# Patient Record
Sex: Female | Born: 1990 | Race: White | Hispanic: No | Marital: Married | Smoking: Never smoker
Health system: Southern US, Community
[De-identification: ages and names within clinical notes are randomized; demographics above are authoritative.]

## PROBLEM LIST (undated history)

## (undated) DIAGNOSIS — F419 Anxiety disorder, unspecified: Secondary | ICD-10-CM

## (undated) DIAGNOSIS — D219 Benign neoplasm of connective and other soft tissue, unspecified: Secondary | ICD-10-CM

## (undated) DIAGNOSIS — O139 Gestational [pregnancy-induced] hypertension without significant proteinuria, unspecified trimester: Secondary | ICD-10-CM

## (undated) HISTORY — PX: HERNIA REPAIR: SHX51

## (undated) HISTORY — PX: COLOSTOMY: SHX63

## (undated) HISTORY — PX: OTHER SURGICAL HISTORY: SHX169

---

## 2019-01-20 LAB — OB RESULTS CONSOLE RPR: RPR: NONREACTIVE

## 2019-01-20 LAB — OB RESULTS CONSOLE RUBELLA ANTIBODY, IGM: Rubella: IMMUNE

## 2019-01-20 LAB — OB RESULTS CONSOLE ABO/RH: RH Type: POSITIVE

## 2019-01-20 LAB — OB RESULTS CONSOLE HIV ANTIBODY (ROUTINE TESTING): HIV: NONREACTIVE

## 2019-01-20 LAB — OB RESULTS CONSOLE ANTIBODY SCREEN: Antibody Screen: NEGATIVE

## 2019-01-20 LAB — OB RESULTS CONSOLE HEPATITIS B SURFACE ANTIGEN: Hepatitis B Surface Ag: NEGATIVE

## 2019-01-20 LAB — OB RESULTS CONSOLE GC/CHLAMYDIA
Chlamydia: NEGATIVE
Gonorrhea: NEGATIVE

## 2019-06-26 DIAGNOSIS — Z3490 Encounter for supervision of normal pregnancy, unspecified, unspecified trimester: Secondary | ICD-10-CM | POA: Diagnosis not present

## 2019-06-26 DIAGNOSIS — Z3403 Encounter for supervision of normal first pregnancy, third trimester: Secondary | ICD-10-CM | POA: Diagnosis not present

## 2019-07-03 DIAGNOSIS — Z3403 Encounter for supervision of normal first pregnancy, third trimester: Secondary | ICD-10-CM | POA: Diagnosis not present

## 2019-07-07 DIAGNOSIS — O99213 Obesity complicating pregnancy, third trimester: Secondary | ICD-10-CM | POA: Diagnosis not present

## 2019-07-07 DIAGNOSIS — Z23 Encounter for immunization: Secondary | ICD-10-CM | POA: Diagnosis not present

## 2019-08-05 DIAGNOSIS — O99213 Obesity complicating pregnancy, third trimester: Secondary | ICD-10-CM | POA: Diagnosis not present

## 2019-08-20 DIAGNOSIS — O321XX0 Maternal care for breech presentation, not applicable or unspecified: Secondary | ICD-10-CM | POA: Diagnosis not present

## 2019-08-20 DIAGNOSIS — Z3490 Encounter for supervision of normal pregnancy, unspecified, unspecified trimester: Secondary | ICD-10-CM | POA: Diagnosis not present

## 2019-08-22 ENCOUNTER — Encounter (HOSPITAL_COMMUNITY): Payer: Self-pay | Admitting: *Deleted

## 2019-08-22 NOTE — Patient Instructions (Signed)
Kimberly Prince  08/22/2019   Your procedure is scheduled on:  09/04/2019  Arrive at 1:15 PM at Entrance C on [address] at [facility]  and [employer] [employer] [address]. You are invited to use the FREE valet parking or use the Visitor's parking deck.  Pick up the phone at the desk and dial [phone].  Call this number if you have problems the morning of surgery: [phone]  Remember:   Do not eat food:(After Midnight) Desps de medianoche.  Do not drink clear liquids: (6 Hours before arrival) 6 horas ante llegada.  Take these medicines the morning of surgery with A SIP OF WATER:  none   Do not wear jewelry, make-up or nail polish.  Do not wear lotions, powders, or perfumes. Do not wear deodorant.  Do not shave 48 hours prior to surgery.  Do not bring valuables to the hospital.  [facility] is not   responsible for any belongings or valuables brought to the hospital.  Contacts, dentures or bridgework may not be worn into surgery.  Leave suitcase in the car. After surgery it may be brought to your room.  For patients admitted to the hospital, checkout time is 11:00 AM the day of              discharge.      Please read over the following fact sheets that you were given:     Preparing for Surgery

## 2019-08-28 ENCOUNTER — Other Ambulatory Visit: Payer: Self-pay

## 2019-08-28 ENCOUNTER — Encounter (HOSPITAL_COMMUNITY): Payer: Self-pay | Admitting: Obstetrics & Gynecology

## 2019-08-28 ENCOUNTER — Inpatient Hospital Stay (EMERGENCY_DEPARTMENT_HOSPITAL)
Admission: AD | Admit: 2019-08-28 | Discharge: 2019-08-29 | Disposition: A | Discharge status: Home / Self Care | Payer: BC Managed Care – PPO | Source: Home / Self Care | Attending: Obstetrics & Gynecology | Admitting: Obstetrics & Gynecology

## 2019-08-28 DIAGNOSIS — O321XX0 Maternal care for breech presentation, not applicable or unspecified: Secondary | ICD-10-CM | POA: Insufficient documentation

## 2019-08-28 DIAGNOSIS — Z3403 Encounter for supervision of normal first pregnancy, third trimester: Secondary | ICD-10-CM | POA: Diagnosis not present

## 2019-08-28 DIAGNOSIS — Z3A38 38 weeks gestation of pregnancy: Secondary | ICD-10-CM | POA: Insufficient documentation

## 2019-08-28 DIAGNOSIS — Z3490 Encounter for supervision of normal pregnancy, unspecified, unspecified trimester: Secondary | ICD-10-CM | POA: Diagnosis not present

## 2019-08-28 DIAGNOSIS — O219 Vomiting of pregnancy, unspecified: Secondary | ICD-10-CM | POA: Diagnosis not present

## 2019-08-28 DIAGNOSIS — O479 False labor, unspecified: Secondary | ICD-10-CM

## 2019-08-28 DIAGNOSIS — O471 False labor at or after 37 completed weeks of gestation: Secondary | ICD-10-CM | POA: Insufficient documentation

## 2019-08-28 HISTORY — DX: Anxiety disorder, unspecified: F41.9

## 2019-08-28 NOTE — MAU Note (Signed)
Pt states she started having cramps Monday and then it stopped for a while and came back the next day and stopped for a while. Today she states they feel more like contractions that started around 1730 and they have not stopped. Rates them a 7/10 on the pain scale. Denies LOF/VB. Reports good fetal movement.

## 2019-08-29 DIAGNOSIS — Q423 Congenital absence, atresia and stenosis of anus without fistula: Secondary | ICD-10-CM | POA: Diagnosis not present

## 2019-08-29 DIAGNOSIS — O321XX0 Maternal care for breech presentation, not applicable or unspecified: Secondary | ICD-10-CM | POA: Diagnosis not present

## 2019-08-29 DIAGNOSIS — O471 False labor at or after 37 completed weeks of gestation: Secondary | ICD-10-CM | POA: Diagnosis not present

## 2019-08-29 DIAGNOSIS — Z3A38 38 weeks gestation of pregnancy: Secondary | ICD-10-CM | POA: Diagnosis not present

## 2019-08-29 DIAGNOSIS — Q439 Congenital malformation of intestine, unspecified: Secondary | ICD-10-CM | POA: Diagnosis not present

## 2019-08-29 DIAGNOSIS — O1413 Severe pre-eclampsia, third trimester: Secondary | ICD-10-CM | POA: Diagnosis not present

## 2019-08-29 DIAGNOSIS — Z20822 Contact with and (suspected) exposure to covid-19: Secondary | ICD-10-CM | POA: Diagnosis not present

## 2019-08-29 DIAGNOSIS — Z01818 Encounter for other preprocedural examination: Secondary | ICD-10-CM | POA: Diagnosis not present

## 2019-08-29 DIAGNOSIS — O1414 Severe pre-eclampsia complicating childbirth: Secondary | ICD-10-CM | POA: Diagnosis not present

## 2019-08-29 DIAGNOSIS — O99214 Obesity complicating childbirth: Secondary | ICD-10-CM | POA: Diagnosis not present

## 2019-08-29 DIAGNOSIS — Z23 Encounter for immunization: Secondary | ICD-10-CM | POA: Diagnosis not present

## 2019-08-29 DIAGNOSIS — O99824 Streptococcus B carrier state complicating childbirth: Secondary | ICD-10-CM | POA: Diagnosis not present

## 2019-08-29 MED ORDER — TYLENOL PM EXTRA STRENGTH 500-25 MG PO TABS: 1.0000 | ORAL_TABLET | Freq: Every evening | ORAL | 0 refills | Status: DC | PRN

## 2019-08-29 NOTE — MAU Provider Note (Signed)
First Provider Initiated Contact with Patient 08/28/19 2248      S: Kimberly Prince is a 29 y.o. G1P0 at [redacted]w[redacted]d  who presents to MAU today complaining of "crampy" contractions since 1730 08/28/2019. She denies vaginal bleeding. She denies LOF. She reports normal fetal movement.  She rates her pain as 7/10. She receives care with [facility].  O: BP 124/62   Pulse 90   Temp 98.1 F (36.7 C)   Resp 18   SpO2 99% Comment: room air GENERAL: Well-developed, well-nourished female in no acute distress.  HEAD: Normocephalic, atraumatic.  CHEST: Normal effort of breathing, regular heart rate ABDOMEN: Soft, nontender, gravid  Cervical exam:  Dilation: 1.5 Effacement (%): 20 Cervical Position: Posterior Station: -3 Presentation: Pilar Plate Breech Exam by:: L.Cox Therapist, sports    Fetal Monitoring: Baseline: 130 Variability: Mod Accelerations: 15 x 15 Decelerations: None Contractions: Rare, UI noted   A: SIUP at [redacted]w[redacted]d  Known breech position, problem list updated False labor Cervix remains 1.5/20/-3 90 minutes after exam Rare contractions on toco  Meds ordered this encounter  Medications  . diphenhydramine-acetaminophen (TYLENOL PM EXTRA STRENGTH) 25-500 MG TABS tablet    Sig: Take 1 tablet by mouth at bedtime as needed for up to 7 doses.    Dispense:  7 tablet    Refill:  0    Order Specific Question:   Supervising Provider    Answer:   Woodroe Mode [[id number]]   P: --Discharge home in stable condition --Scheduled for primary cesarean for breech on 09/04/2019  Mallie Snooks, CNM 08/29/2019 12:37 AM

## 2019-08-29 NOTE — Discharge Instructions (Signed)
Braxton Hicks Contractions °Contractions of the uterus can occur throughout pregnancy, but they are not always a sign that you are in labor. You may have practice contractions called Braxton Hicks contractions. These false labor contractions are sometimes confused with true labor. °What are Braxton Hicks contractions? °Braxton Hicks contractions are tightening movements that occur in the muscles of the uterus before labor. Unlike true labor contractions, these contractions do not result in opening (dilation) and thinning of the cervix. Toward the end of pregnancy (32-34 weeks), Braxton Hicks contractions can happen more often and may become stronger. These contractions are sometimes difficult to tell apart from true labor because they can be very uncomfortable. You should not feel embarrassed if you go to the hospital with false labor. °Sometimes, the only way to tell if you are in true labor is for your health care provider to look for changes in the cervix. The health care provider will do a physical exam and may monitor your contractions. If you are not in true labor, the exam should show that your cervix is not dilating and your water has not broken. °If there are no other health problems associated with your pregnancy, it is completely safe for you to be sent home with false labor. You may continue to have Braxton Hicks contractions until you go into true labor. °How to tell the difference between true labor and false labor °True labor °· Contractions last 30-70 seconds. °· Contractions become very regular. °· Discomfort is usually felt in the top of the uterus, and it spreads to the lower abdomen and low back. °· Contractions do not go away with walking. °· Contractions usually become more intense and increase in frequency. °· The cervix dilates and gets thinner. °False labor °· Contractions are usually shorter and not as strong as true labor contractions. °· Contractions are usually irregular. °· Contractions  are often felt in the front of the lower abdomen and in the groin. °· Contractions may go away when you walk around or change positions while lying down. °· Contractions get weaker and are shorter-lasting as time goes on. °· The cervix usually does not dilate or become thin. °Follow these instructions at home: ° °· Take over-the-counter and prescription medicines only as told by your health care provider. °· Keep up with your usual exercises and follow other instructions from your health care provider. °· Eat and drink lightly if you think you are going into labor. °· If Braxton Hicks contractions are making you uncomfortable: °? Change your position from lying down or resting to walking, or change from walking to resting. °? Sit and rest in a tub of warm water. °? Drink enough fluid to keep your urine pale yellow. Dehydration may cause these contractions. °? Do slow and deep breathing several times an hour. °· Keep all follow-up prenatal visits as told by your health care provider. This is important. °Contact a health care provider if: °· You have a fever. °· You have continuous pain in your abdomen. °Get help right away if: °· Your contractions become stronger, more regular, and closer together. °· You have fluid leaking or gushing from your vagina. °· You pass blood-tinged mucus (bloody show). °· You have bleeding from your vagina. °· You have low back pain that you never had before. °· You feel your baby’s head pushing down and causing pelvic pressure. °· Your baby is not moving inside you as much as it used to. °Summary °· Contractions that occur before labor are   called Braxton Hicks contractions, false labor, or practice contractions. °· Braxton Hicks contractions are usually shorter, weaker, farther apart, and less regular than true labor contractions. True labor contractions usually become progressively stronger and regular, and they become more frequent. °· Manage discomfort from Braxton Hicks contractions  by changing position, resting in a warm bath, drinking plenty of water, or practicing deep breathing. °This information is not intended to replace advice given to you by your health care provider. Make sure you discuss any questions you have with your health care provider. °Document Revised: 12/15/2016 Document Reviewed: 05/18/2016 °Elsevier Patient Education © 2020 [employer] ° °

## 2019-09-01 ENCOUNTER — Inpatient Hospital Stay (HOSPITAL_COMMUNITY)
Admission: AD | Admit: 2019-09-01 | Payer: BC Managed Care – PPO | Source: Home / Self Care | Admitting: Obstetrics and Gynecology

## 2019-09-01 ENCOUNTER — Inpatient Hospital Stay (HOSPITAL_COMMUNITY)
Admission: AD | Admit: 2019-09-01 | Discharge: 2019-09-04 | DRG: 788 | Disposition: A | Discharge status: Home / Self Care | Payer: BC Managed Care – PPO | Attending: Obstetrics and Gynecology | Admitting: Obstetrics and Gynecology

## 2019-09-01 ENCOUNTER — Inpatient Hospital Stay (HOSPITAL_COMMUNITY): Payer: BC Managed Care – PPO | Admitting: Anesthesiology

## 2019-09-01 ENCOUNTER — Encounter (HOSPITAL_COMMUNITY)
Admission: AD | Disposition: A | Discharge status: Home / Self Care | Payer: Self-pay | Source: Home / Self Care | Attending: Obstetrics and Gynecology

## 2019-09-01 ENCOUNTER — Other Ambulatory Visit: Payer: Self-pay

## 2019-09-01 ENCOUNTER — Encounter (HOSPITAL_COMMUNITY): Payer: Self-pay | Admitting: Obstetrics and Gynecology

## 2019-09-01 DIAGNOSIS — O1413 Severe pre-eclampsia, third trimester: Secondary | ICD-10-CM

## 2019-09-01 DIAGNOSIS — O1414 Severe pre-eclampsia complicating childbirth: Principal | ICD-10-CM | POA: Diagnosis present

## 2019-09-01 DIAGNOSIS — Z3A38 38 weeks gestation of pregnancy: Secondary | ICD-10-CM

## 2019-09-01 DIAGNOSIS — Z20822 Contact with and (suspected) exposure to covid-19: Secondary | ICD-10-CM | POA: Diagnosis present

## 2019-09-01 DIAGNOSIS — O99214 Obesity complicating childbirth: Secondary | ICD-10-CM | POA: Diagnosis present

## 2019-09-01 DIAGNOSIS — Z01818 Encounter for other preprocedural examination: Secondary | ICD-10-CM | POA: Diagnosis not present

## 2019-09-01 DIAGNOSIS — O321XX0 Maternal care for breech presentation, not applicable or unspecified: Secondary | ICD-10-CM | POA: Diagnosis present

## 2019-09-01 DIAGNOSIS — O99824 Streptococcus B carrier state complicating childbirth: Secondary | ICD-10-CM | POA: Diagnosis present

## 2019-09-01 DIAGNOSIS — Z3689 Encounter for other specified antenatal screening: Secondary | ICD-10-CM

## 2019-09-01 LAB — COMPREHENSIVE METABOLIC PANEL
ALT: 21 U/L (ref 0–44)
AST: 18 U/L (ref 15–41)
Albumin: 2.7 g/dL — ABNORMAL LOW (ref 3.5–5.0)
Alkaline Phosphatase: 152 U/L — ABNORMAL HIGH (ref 38–126)
Anion gap: 10 (ref 5–15)
BUN: <5 mg/dL — ABNORMAL LOW (ref 6–20)
CO2: 20 mmol/L — ABNORMAL LOW (ref 22–32)
Calcium: 9.2 mg/dL (ref 8.9–10.3)
Chloride: 104 mmol/L (ref 98–111)
Creatinine, Ser: 0.5 mg/dL (ref 0.44–1.00)
GFR calc Af Amer: >60 mL/min (ref >60)
GFR calc non Af Amer: >60 mL/min (ref >60)
Glucose, Bld: 104 mg/dL — ABNORMAL HIGH (ref 70–99)
Potassium: 3.9 mmol/L (ref 3.5–5.1)
Sodium: 134 mmol/L — ABNORMAL LOW (ref 135–145)
Total Bilirubin: 0.4 mg/dL (ref 0.3–1.2)
Total Protein: 6.4 g/dL — ABNORMAL LOW (ref 6.5–8.1)

## 2019-09-01 LAB — URINALYSIS, ROUTINE W REFLEX MICROSCOPIC
Bilirubin Urine: NEGATIVE
Glucose, UA: NEGATIVE mg/dL
Hgb urine dipstick: NEGATIVE
Ketones, ur: NEGATIVE mg/dL
Nitrite: NEGATIVE
Protein, ur: NEGATIVE mg/dL
Specific Gravity, Urine: 1.014 (ref 1.005–1.030)
pH: 6 (ref 5.0–8.0)

## 2019-09-01 LAB — TYPE AND SCREEN
ABO/RH(D): A POS
Antibody Screen: NEGATIVE

## 2019-09-01 LAB — CBC
HCT: 38.4 % (ref 36.0–46.0)
Hemoglobin: 12.5 g/dL (ref 12.0–15.0)
MCH: 27.7 pg (ref 26.0–34.0)
MCHC: 32.6 g/dL (ref 30.0–36.0)
MCV: 85 fL (ref 80.0–100.0)
Platelets: 223 10*3/uL (ref 150–400)
RBC: 4.52 MIL/uL (ref 3.87–5.11)
RDW: 12.9 % (ref 11.5–15.5)
WBC: 12.9 10*3/uL — ABNORMAL HIGH (ref 4.0–10.5)
nRBC: 0 % (ref 0.0–0.2)

## 2019-09-01 LAB — PROTEIN / CREATININE RATIO, URINE
Creatinine, Urine: 177.03 mg/dL
Protein Creatinine Ratio: 0.11 mg/mg{Cre} (ref 0.00–0.15)
Total Protein, Urine: 20 mg/dL

## 2019-09-01 LAB — SARS CORONAVIRUS 2 BY RT PCR (HOSPITAL ORDER, PERFORMED IN ~~LOC~~ HOSPITAL LAB): SARS Coronavirus 2: NEGATIVE

## 2019-09-01 SURGERY — Surgical Case
Anesthesia: Spinal

## 2019-09-01 MED ORDER — LORATADINE 10 MG PO TABS
10.0000 mg | ORAL_TABLET | Freq: Every day | ORAL | Status: DC
  Administered 2019-09-01 – 2019-09-03 (×3): 10 mg via ORAL
  Filled 2019-09-01 (×4): qty 1

## 2019-09-01 MED ORDER — SIMETHICONE 80 MG PO CHEW
80.0000 mg | CHEWABLE_TABLET | ORAL | Status: DC
  Administered 2019-09-02 – 2019-09-04 (×3): 80 mg via ORAL
  Filled 2019-09-01 (×3): qty 1

## 2019-09-01 MED ORDER — KETOROLAC TROMETHAMINE 30 MG/ML IJ SOLN
INTRAMUSCULAR | Status: AC
  Filled 2019-09-01: qty 1

## 2019-09-01 MED ORDER — NALBUPHINE HCL 10 MG/ML IJ SOLN: 5.0000 mg | INTRAMUSCULAR | Status: DC | PRN

## 2019-09-01 MED ORDER — MEPERIDINE HCL 25 MG/ML IJ SOLN: 6.2500 mg | INTRAMUSCULAR | Status: DC | PRN

## 2019-09-01 MED ORDER — WITCH HAZEL-GLYCERIN EX PADS: 1.0000 "application " | MEDICATED_PAD | CUTANEOUS | Status: DC | PRN

## 2019-09-01 MED ORDER — PRENATAL MULTIVITAMIN CH
1.0000 | ORAL_TABLET | Freq: Every day | ORAL | Status: DC
  Administered 2019-09-02 – 2019-09-03 (×2): 1 via ORAL
  Filled 2019-09-01 (×2): qty 1

## 2019-09-01 MED ORDER — SODIUM CHLORIDE 0.9 % IV SOLN: INTRAVENOUS | Status: DC | PRN

## 2019-09-01 MED ORDER — KETOROLAC TROMETHAMINE 30 MG/ML IJ SOLN: 30.0000 mg | Freq: Four times a day (QID) | INTRAMUSCULAR | Status: AC | PRN

## 2019-09-01 MED ORDER — NALOXONE HCL 4 MG/10ML IJ SOLN
1.0000 ug/kg/h | INTRAVENOUS | Status: DC | PRN
  Filled 2019-09-01: qty 5

## 2019-09-01 MED ORDER — DIPHENHYDRAMINE HCL 25 MG PO CAPS: 25.0000 mg | ORAL_CAPSULE | ORAL | Status: DC | PRN

## 2019-09-01 MED ORDER — STERILE WATER FOR IRRIGATION IR SOLN
Status: DC | PRN
  Administered 2019-09-01: 1000 mL

## 2019-09-01 MED ORDER — MORPHINE SULFATE (PF) 0.5 MG/ML IJ SOLN
INTRAMUSCULAR | Status: DC | PRN
  Administered 2019-09-01: .15 mg via INTRATHECAL

## 2019-09-01 MED ORDER — DEXAMETHASONE SODIUM PHOSPHATE 10 MG/ML IJ SOLN
INTRAMUSCULAR | Status: AC
  Filled 2019-09-01: qty 1

## 2019-09-01 MED ORDER — DEXTROSE 5 % IV SOLN
INTRAVENOUS | Status: AC
  Filled 2019-09-01: qty 3000

## 2019-09-01 MED ORDER — ONDANSETRON HCL 4 MG/2ML IJ SOLN
INTRAMUSCULAR | Status: DC | PRN
  Administered 2019-09-01: 4 mg via INTRAVENOUS

## 2019-09-01 MED ORDER — NALBUPHINE HCL 10 MG/ML IJ SOLN: 5.0000 mg | Freq: Once | INTRAMUSCULAR | Status: DC | PRN

## 2019-09-01 MED ORDER — FENTANYL CITRATE (PF) 100 MCG/2ML IJ SOLN
INTRAMUSCULAR | Status: AC
  Filled 2019-09-01: qty 2

## 2019-09-01 MED ORDER — HYDRALAZINE HCL 20 MG/ML IJ SOLN: 10.0000 mg | INTRAMUSCULAR | Status: DC | PRN

## 2019-09-01 MED ORDER — SODIUM CHLORIDE 0.9% FLUSH: 3.0000 mL | INTRAVENOUS | Status: DC | PRN

## 2019-09-01 MED ORDER — DIPHENHYDRAMINE HCL 25 MG PO CAPS: 25.0000 mg | ORAL_CAPSULE | Freq: Four times a day (QID) | ORAL | Status: DC | PRN

## 2019-09-01 MED ORDER — SIMETHICONE 80 MG PO CHEW
80.0000 mg | CHEWABLE_TABLET | Freq: Three times a day (TID) | ORAL | Status: DC
  Administered 2019-09-01 – 2019-09-04 (×8): 80 mg via ORAL
  Filled 2019-09-01 (×8): qty 1

## 2019-09-01 MED ORDER — FENTANYL CITRATE (PF) 100 MCG/2ML IJ SOLN
INTRAMUSCULAR | Status: DC | PRN
  Administered 2019-09-01: 15 ug via INTRATHECAL

## 2019-09-01 MED ORDER — ACETAMINOPHEN 500 MG PO TABS: 1000.0000 mg | ORAL_TABLET | Freq: Four times a day (QID) | ORAL | Status: DC

## 2019-09-01 MED ORDER — MENTHOL 3 MG MT LOZG: 1.0000 | LOZENGE | OROMUCOSAL | Status: DC | PRN

## 2019-09-01 MED ORDER — DIBUCAINE (PERIANAL) 1 % EX OINT: 1.0000 "application " | TOPICAL_OINTMENT | CUTANEOUS | Status: DC | PRN

## 2019-09-01 MED ORDER — HYDROMORPHONE HCL 1 MG/ML IJ SOLN: 0.2000 mg | INTRAMUSCULAR | Status: DC | PRN

## 2019-09-01 MED ORDER — NALOXONE HCL 0.4 MG/ML IJ SOLN: 0.4000 mg | INTRAMUSCULAR | Status: DC | PRN

## 2019-09-01 MED ORDER — HYDROMORPHONE HCL 1 MG/ML IJ SOLN: 0.2500 mg | INTRAMUSCULAR | Status: DC | PRN

## 2019-09-01 MED ORDER — DIPHENHYDRAMINE HCL 50 MG/ML IJ SOLN: 12.5000 mg | INTRAMUSCULAR | Status: DC | PRN

## 2019-09-01 MED ORDER — ACETAMINOPHEN 500 MG PO TABS
1000.0000 mg | ORAL_TABLET | Freq: Four times a day (QID) | ORAL | Status: DC
  Administered 2019-09-01 – 2019-09-04 (×10): 1000 mg via ORAL
  Filled 2019-09-01 (×11): qty 2

## 2019-09-01 MED ORDER — DIPHENHYDRAMINE HCL 50 MG/ML IJ SOLN
INTRAMUSCULAR | Status: AC
  Filled 2019-09-01: qty 1

## 2019-09-01 MED ORDER — PROMETHAZINE HCL 25 MG/ML IJ SOLN: 6.2500 mg | INTRAMUSCULAR | Status: DC | PRN

## 2019-09-01 MED ORDER — LABETALOL HCL 5 MG/ML IV SOLN: 40.0000 mg | INTRAVENOUS | Status: DC | PRN

## 2019-09-01 MED ORDER — SERTRALINE HCL 50 MG PO TABS
50.0000 mg | ORAL_TABLET | Freq: Every day | ORAL | Status: DC
  Administered 2019-09-01 – 2019-09-03 (×3): 50 mg via ORAL
  Filled 2019-09-01 (×3): qty 1

## 2019-09-01 MED ORDER — PHENYLEPHRINE HCL (PRESSORS) 10 MG/ML IV SOLN
INTRAVENOUS | Status: DC | PRN
  Administered 2019-09-01: 200 ug via INTRAVENOUS
  Administered 2019-09-01: 100 ug via INTRAVENOUS

## 2019-09-01 MED ORDER — FERROUS SULFATE 325 (65 FE) MG PO TABS
325.0000 mg | ORAL_TABLET | Freq: Two times a day (BID) | ORAL | Status: DC
  Administered 2019-09-01 – 2019-09-04 (×6): 325 mg via ORAL
  Filled 2019-09-01 (×6): qty 1

## 2019-09-01 MED ORDER — BUPIVACAINE IN DEXTROSE 0.75-8.25 % IT SOLN
INTRATHECAL | Status: DC | PRN
  Administered 2019-09-01: 1.6 mL via INTRATHECAL

## 2019-09-01 MED ORDER — KETOROLAC TROMETHAMINE 30 MG/ML IJ SOLN
30.0000 mg | Freq: Once | INTRAMUSCULAR | Status: AC | PRN
  Administered 2019-09-01: 30 mg via INTRAVENOUS

## 2019-09-01 MED ORDER — SCOPOLAMINE 1 MG/3DAYS TD PT72
1.0000 | MEDICATED_PATCH | Freq: Once | TRANSDERMAL | Status: AC
  Administered 2019-09-01: 1.5 mg via TRANSDERMAL

## 2019-09-01 MED ORDER — SCOPOLAMINE 1 MG/3DAYS TD PT72
MEDICATED_PATCH | TRANSDERMAL | Status: AC
  Filled 2019-09-01: qty 1

## 2019-09-01 MED ORDER — PHENYLEPHRINE HCL-NACL 20-0.9 MG/250ML-% IV SOLN
INTRAVENOUS | Status: DC | PRN
  Administered 2019-09-01: 60 ug/min via INTRAVENOUS

## 2019-09-01 MED ORDER — OXYCODONE HCL 5 MG PO TABS
5.0000 mg | ORAL_TABLET | ORAL | Status: DC | PRN
  Administered 2019-09-03 (×2): 5 mg via ORAL
  Filled 2019-09-01 (×2): qty 1

## 2019-09-01 MED ORDER — LABETALOL HCL 5 MG/ML IV SOLN: 20.0000 mg | INTRAVENOUS | Status: DC | PRN

## 2019-09-01 MED ORDER — LABETALOL HCL 5 MG/ML IV SOLN: 80.0000 mg | INTRAVENOUS | Status: DC | PRN

## 2019-09-01 MED ORDER — IBUPROFEN 800 MG PO TABS
800.0000 mg | ORAL_TABLET | Freq: Three times a day (TID) | ORAL | Status: DC
  Administered 2019-09-02 – 2019-09-04 (×6): 800 mg via ORAL
  Filled 2019-09-01 (×6): qty 1

## 2019-09-01 MED ORDER — CARBOPROST TROMETHAMINE 250 MCG/ML IM SOLN
INTRAMUSCULAR | Status: AC
  Filled 2019-09-01: qty 1

## 2019-09-01 MED ORDER — COCONUT OIL OIL: 1.0000 "application " | TOPICAL_OIL | Status: DC | PRN

## 2019-09-01 MED ORDER — DEXTROSE 5 % IV SOLN
3.0000 g | INTRAVENOUS | Status: AC
  Administered 2019-09-01: 3 g via INTRAVENOUS

## 2019-09-01 MED ORDER — OXYTOCIN-SODIUM CHLORIDE 30-0.9 UT/500ML-% IV SOLN
INTRAVENOUS | Status: DC | PRN
  Administered 2019-09-01: 30 [IU] via INTRAVENOUS

## 2019-09-01 MED ORDER — SENNOSIDES-DOCUSATE SODIUM 8.6-50 MG PO TABS
2.0000 | ORAL_TABLET | ORAL | Status: DC
  Administered 2019-09-02 – 2019-09-04 (×3): 2 via ORAL
  Filled 2019-09-01 (×3): qty 2

## 2019-09-01 MED ORDER — ZOLPIDEM TARTRATE 5 MG PO TABS: 5.0000 mg | ORAL_TABLET | Freq: Every evening | ORAL | Status: DC | PRN

## 2019-09-01 MED ORDER — SIMETHICONE 80 MG PO CHEW: 80.0000 mg | CHEWABLE_TABLET | ORAL | Status: DC | PRN

## 2019-09-01 MED ORDER — LACTATED RINGERS IV SOLN: INTRAVENOUS | Status: DC

## 2019-09-01 MED ORDER — MAGNESIUM SULFATE BOLUS VIA INFUSION
4.0000 g | Freq: Once | INTRAVENOUS | Status: AC
  Administered 2019-09-01: 4 g via INTRAVENOUS
  Filled 2019-09-01: qty 1000

## 2019-09-01 MED ORDER — PHENYLEPHRINE 40 MCG/ML (10ML) SYRINGE FOR IV PUSH (FOR BLOOD PRESSURE SUPPORT)
PREFILLED_SYRINGE | INTRAVENOUS | Status: AC
  Filled 2019-09-01: qty 10

## 2019-09-01 MED ORDER — ENOXAPARIN SODIUM 80 MG/0.8ML ~~LOC~~ SOLN
70.0000 mg | SUBCUTANEOUS | Status: DC
  Administered 2019-09-02 – 2019-09-03 (×2): 70 mg via SUBCUTANEOUS
  Filled 2019-09-01 (×2): qty 0.8

## 2019-09-01 MED ORDER — DIPHENHYDRAMINE HCL 50 MG/ML IJ SOLN
INTRAMUSCULAR | Status: DC | PRN
  Administered 2019-09-01: 25 mg via INTRAVENOUS

## 2019-09-01 MED ORDER — ONDANSETRON HCL 4 MG/2ML IJ SOLN: 4.0000 mg | Freq: Three times a day (TID) | INTRAMUSCULAR | Status: DC | PRN

## 2019-09-01 MED ORDER — SOD CITRATE-CITRIC ACID 500-334 MG/5ML PO SOLN
30.0000 mL | ORAL | Status: AC
  Administered 2019-09-01: 30 mL via ORAL
  Filled 2019-09-01: qty 30

## 2019-09-01 MED ORDER — ONDANSETRON HCL 4 MG/2ML IJ SOLN
INTRAMUSCULAR | Status: AC
  Filled 2019-09-01: qty 2

## 2019-09-01 MED ORDER — MORPHINE SULFATE (PF) 0.5 MG/ML IJ SOLN
INTRAMUSCULAR | Status: AC
  Filled 2019-09-01: qty 10

## 2019-09-01 MED ORDER — CARBOPROST TROMETHAMINE 250 MCG/ML IM SOLN
INTRAMUSCULAR | Status: DC | PRN
Start: 2019-09-01 — End: 2019-09-01
  Administered 2019-09-01: 250 ug via INTRAMUSCULAR

## 2019-09-01 MED ORDER — DEXAMETHASONE SODIUM PHOSPHATE 4 MG/ML IJ SOLN
INTRAMUSCULAR | Status: DC | PRN
  Administered 2019-09-01: 10 mg via INTRAVENOUS

## 2019-09-01 MED ORDER — OXYTOCIN-SODIUM CHLORIDE 30-0.9 UT/500ML-% IV SOLN
INTRAVENOUS | Status: AC
  Filled 2019-09-01: qty 500

## 2019-09-01 MED ORDER — IBUPROFEN 800 MG PO TABS: 800.0000 mg | ORAL_TABLET | Freq: Three times a day (TID) | ORAL | Status: DC

## 2019-09-01 MED ORDER — SODIUM CHLORIDE 0.9 % IR SOLN
Status: DC | PRN
  Administered 2019-09-01: 1

## 2019-09-01 MED ORDER — OXYTOCIN-SODIUM CHLORIDE 30-0.9 UT/500ML-% IV SOLN: 2.5000 [IU]/h | INTRAVENOUS | Status: AC

## 2019-09-01 MED ORDER — MAGNESIUM SULFATE 40 GM/1000ML IV SOLN
2.0000 g/h | INTRAVENOUS | Status: AC
  Administered 2019-09-02: 2 g/h via INTRAVENOUS
  Filled 2019-09-01 (×2): qty 1000

## 2019-09-01 SURGICAL SUPPLY — 38 items
BARRIER ADHS 3X4 INTERCEED (GAUZE/BANDAGES/DRESSINGS) ×3 IMPLANT
BENZOIN TINCTURE PRP APPL 2/3 (GAUZE/BANDAGES/DRESSINGS) ×3 IMPLANT
CHLORAPREP W/TINT 26ML (MISCELLANEOUS) ×3 IMPLANT
CLAMP CORD UMBIL (MISCELLANEOUS) IMPLANT
CLOSURE WOUND 1/2 X4 (GAUZE/BANDAGES/DRESSINGS) ×1
CLOTH BEACON ORANGE TIMEOUT ST (SAFETY) ×3 IMPLANT
DRSG OPSITE POSTOP 4X10 (GAUZE/BANDAGES/DRESSINGS) ×3 IMPLANT
ELECT REM PT RETURN 9FT ADLT (ELECTROSURGICAL) ×3
ELECTRODE REM PT RTRN 9FT ADLT (ELECTROSURGICAL) ×1 IMPLANT
EXTRACTOR VACUUM KIWI (MISCELLANEOUS) IMPLANT
GLOVE BIOGEL M 7.0 STRL (GLOVE) ×6 IMPLANT
GLOVE BIOGEL PI IND STRL 7.0 (GLOVE) ×3 IMPLANT
GLOVE BIOGEL PI INDICATOR 7.0 (GLOVE) ×6
GOWN STRL REUS W/TWL LRG LVL3 (GOWN DISPOSABLE) ×9 IMPLANT
HOVERMATT SINGLE USE (MISCELLANEOUS) ×3 IMPLANT
KIT ABG SYR 3ML LUER SLIP (SYRINGE) IMPLANT
NEEDLE HYPO 25X5/8 SAFETYGLIDE (NEEDLE) IMPLANT
NS IRRIG 1000ML POUR BTL (IV SOLUTION) ×3 IMPLANT
PACK C SECTION WH (CUSTOM PROCEDURE TRAY) ×3 IMPLANT
PAD OB MATERNITY 4.3X12.25 (PERSONAL CARE ITEMS) ×3 IMPLANT
PENCIL SMOKE EVAC W/HOLSTER (ELECTROSURGICAL) ×3 IMPLANT
RETRACTOR TRAXI PANNICULUS (MISCELLANEOUS) ×1 IMPLANT
RTRCTR C-SECT PINK 25CM LRG (MISCELLANEOUS) IMPLANT
SPONGE LAP 18X18 RF (DISPOSABLE) ×3 IMPLANT
STRIP CLOSURE SKIN 1/2X4 (GAUZE/BANDAGES/DRESSINGS) ×2 IMPLANT
SUT PDS AB 0 CT1 27 (SUTURE) ×6 IMPLANT
SUT PLAIN 0 NONE (SUTURE) IMPLANT
SUT VIC AB 0 CTX 36 (SUTURE) ×6
SUT VIC AB 0 CTX36XBRD ANBCTRL (SUTURE) ×3 IMPLANT
SUT VIC AB 2-0 CT1 27 (SUTURE) ×2
SUT VIC AB 2-0 CT1 TAPERPNT 27 (SUTURE) ×1 IMPLANT
SUT VIC AB 3-0 SH 27 (SUTURE)
SUT VIC AB 3-0 SH 27X BRD (SUTURE) IMPLANT
SUT VIC AB 4-0 KS 27 (SUTURE) ×3 IMPLANT
TOWEL OR 17X24 6PK STRL BLUE (TOWEL DISPOSABLE) ×3 IMPLANT
TRAXI PANNICULUS RETRACTOR (MISCELLANEOUS) ×2
TRAY FOLEY W/BAG SLVR 14FR LF (SET/KITS/TRAYS/PACK) ×3 IMPLANT
WATER STERILE IRR 1000ML POUR (IV SOLUTION) ×3 IMPLANT

## 2019-09-01 NOTE — MAU Note (Signed)
Pt was seen in MAU on Thursday for cramps and was found to have elevated BP.  Was seen in office today for f/u and BP still elevated.  No HA or blurred vision.  Had HA on Friday and seeing some floaters over the weekend, but not now.  Reports good fetal movement.

## 2019-09-01 NOTE — Anesthesia Procedure Notes (Signed)
Spinal  Patient location during procedure: OR Start time: 09/01/2019 2:07 PM End time: 09/01/2019 2:10 PM Staffing Performed: anesthesiologist  Anesthesiologist: Lyn Hollingshead, MD Preanesthetic Checklist Completed: patient identified, IV checked, site marked, risks and benefits discussed, surgical consent, monitors and equipment checked, pre-op evaluation and timeout performed Spinal Block Patient position: sitting Prep: DuraPrep and site prepped and draped Patient monitoring: continuous pulse ox and blood pressure Approach: midline Location: L3-4 Injection technique: single-shot Needle Needle type: Pencan  Needle gauge: 24 G Needle length: 10 cm Needle insertion depth: 7 cm Assessment Sensory level: T4

## 2019-09-01 NOTE — Anesthesia Preprocedure Evaluation (Signed)
Anesthesia Evaluation  Patient identified by MRN, date of birth, ID band Patient awake    Reviewed: Allergy & Precautions, H&P , NPO status , Patient's Chart, lab work & pertinent test results  Airway Mallampati: III  TM Distance: >3 FB Neck ROM: full    Dental no notable dental hx.    Pulmonary neg pulmonary ROS,    Pulmonary exam normal breath sounds clear to auscultation       Cardiovascular  Rhythm:regular Rate:Normal     Neuro/Psych PSYCHIATRIC DISORDERS Anxiety negative neurological ROS     GI/Hepatic negative GI ROS, Neg liver ROS,   Endo/Other  Morbid obesity  Renal/GU negative Renal ROS  negative genitourinary   Musculoskeletal negative musculoskeletal ROS (+)   Abdominal   Peds  Hematology negative hematology ROS (+)   Anesthesia Other Findings   Reproductive/Obstetrics (+) Pregnancy                             Anesthesia Physical Anesthesia Plan  ASA: III  Anesthesia Plan: Spinal   Post-op Pain Management:    Induction:   PONV Risk Score and Plan: Ondansetron, Dexamethasone and Scopolamine patch - Pre-op  Airway Management Planned: Natural Airway and Nasal Cannula  Additional Equipment: None  Intra-op Plan:   Post-operative Plan:   Informed Consent: I have reviewed the patients History and Physical, chart, labs and discussed the procedure including the risks, benefits and alternatives for the proposed anesthesia with the patient or authorized representative who has indicated his/her understanding and acceptance.       Plan Discussed with: CRNA  Anesthesia Plan Comments:         Anesthesia Quick Evaluation

## 2019-09-01 NOTE — Op Note (Signed)
Cesarean Section Procedure Note  Indications: malpresentation: Breech and severe preeclampsia  Pre-operative Diagnosis: 38 week 4 day pregnancy.  Post-operative Diagnosis: same  Surgeon: Christophe Louis M.D.  Assistants: Dr. Drema Dallas  Anesthesia: Spinal anesthesia  ASA Class: 2   Procedure Details   The patient was seen in the Holding Room. The risks, benefits, complications, treatment options, and expected outcomes were discussed with the patient.  The patient concurred with the proposed plan, giving informed consent.  The site of surgery properly noted/marked. The patient was taken to Operating Room # A, identified as Kimberly Prince and the procedure verified as C-Section Delivery. A Time Out was held and the above information confirmed.  After induction of anesthesia, the patient was draped and prepped in the usual sterile manner. A Pfannenstiel incision was made and carried down through the subcutaneous tissue to the fascia. Fascial incision was made and extended transversely. The fascia was separated from the underlying rectus tissue superiorly and inferiorly. The peritoneum was identified and entered. Peritoneal incision was extended longitudinally. The utero-vesical peritoneal reflection was incised transversely and the bladder flap was bluntly freed from the lower uterine segment. A low transverse uterine incision was made. Delivered from breech  presentation was a pending  gram Female with Apgar scores of 9 at one minute and 9 at five minutes. After the umbilical cord was clamped and cut cord blood was obtained for evaluation. The placenta was removed intact and appeared normal. The uterine outline, tubes and ovaries appeared normal. The uterine incision was closed with running locked sutures of 0 vicryl. A second layer of 0 vicryl was used to imbricate the incision . Hemostasis was observed. Lavage was carried out until clear. interseed was placed along the uterine incision . The fascia  was then reapproximated with running sutures of 0 pds. The skin was reapproximated with 4-0 vicryl.  Instrument, sponge, and needle counts were correct prior the abdominal closure and at the conclusion of the case.   Findings: Female infant in the breech presentation . Normal appearing fallopian tubes and ovaries   Estimated Blood Loss:  600 mL         Drains: None         Total IV Fluids:  Per anesthesia ml         Specimens: Placenta sent to labor and delivery           Implants: NOne         Complications:  None; patient tolerated the procedure well.         Disposition: PACU - hemodynamically stable.         Condition: stable  Attending Attestation: I performed the procedure.

## 2019-09-01 NOTE — Transfer of Care (Cosign Needed)
Immediate Anesthesia Transfer of Care Note  Patient: Kimberly Prince  Procedure(s) Performed: CESAREAN SECTION-REQUEST RNFA (N/A )  Patient Location: PACU  Anesthesia Type:Spinal  Level of Consciousness: awake, alert , oriented and patient cooperative  Airway & Oxygen Therapy: Patient Spontanous Breathing  Post-op Assessment: Report given to RN and Post -op Vital signs reviewed and stable  Post vital signs: Reviewed and stable  Last Vitals:  Vitals Value Taken Time  BP 114/76 09/01/19 1537  Temp    Pulse 84 09/01/19 1540  Resp 18 09/01/19 1540  SpO2 99 % 09/01/19 1540  Vitals shown include unvalidated device data.  Last Pain:  Vitals:   09/01/19 1233  TempSrc: Oral  PainSc:       Patients Stated Pain Goal: 7 (56/15/37 9432)  Complications: No complications documented.

## 2019-09-01 NOTE — MAU Provider Note (Signed)
History     CSN: [id number]  Arrival date and time: 09/01/19 1037   First Provider Initiated Contact with Patient 09/01/19 1138      Chief Complaint  Patient presents with  . Hypertension   [name] is a 29 y.o. G1P0 at [redacted]w[redacted]d who presents to MAU for preeclampsia evaluation after she was seen in the clinic and found to have new onset elevated pressures in clinic. Patient endorses seeing spots this weekend and again in MAU on arrival. Patient also endorses a return of nausea and vomiting in the past month and endorses vomiting this morning. Patient has not eaten since last night and had some Propel around 9AM this morning. Per Dr. Landry Mellow, pt has had a PCr of 0.38 in the office last week.  Pt denies HA, blurry vision, epigastric pain, swelling in face and hands, sudden weight gain. Pt denies chest pain and SOB.  Pt denies constipation, diarrhea, or urinary problems. Pt denies fever, chills, fatigue, sweating or changes in appetite. Pt denies dizziness, light-headedness, weakness.  Pt denies VB, ctx, LOF and reports good FM.  Current pregnancy problems? Breech, obesity Blood Type? A Positive Allergies? sulfa Current medications? PNV, Zyrtec, Sertraline Current PNC & next appt? [facility], C/S scheduled 09/04/2019   OB History    Gravida  1   Para      Term      Preterm      AB      Living        SAB      TAB      Ectopic      Multiple      Live Births              Past Medical History:  Diagnosis Date  . Anxiety     Past Surgical History:  Procedure Laterality Date  . anastamosis of colon     as an infant    History reviewed. No pertinent family history.  Social History   Tobacco Use  . Smoking status: Never Smoker  . Smokeless tobacco: Never Used  Vaping Use  . Vaping Use: Never used  Substance Use Topics  . Alcohol use: Not Currently  . Drug use: Never    Allergies:  Allergies  Allergen Reactions  . Sulfa Antibiotics Hives     Medications Prior to Admission  Medication Sig Dispense Refill Last Dose  . cetirizine (ZYRTEC) 10 MG tablet Take 10 mg by mouth at bedtime.   08/31/2019 at Unknown time  . diphenhydramine-acetaminophen (TYLENOL PM EXTRA STRENGTH) 25-500 MG TABS tablet Take 1 tablet by mouth at bedtime as needed for up to 7 doses. 7 tablet 0 Past Week at Unknown time  . Prenatal Vit-Fe Fumarate-FA (PRENATAL MULTIVITAMIN) TABS tablet Take 1 tablet by mouth at bedtime.   08/31/2019 at Unknown time  . sertraline (ZOLOFT) 50 MG tablet Take 50 mg by mouth at bedtime.    08/31/2019 at Unknown time    Review of Systems  Constitutional: Negative for chills, diaphoresis, fatigue and fever.  Eyes: Positive for visual disturbance.  Respiratory: Negative for shortness of breath.   Cardiovascular: Negative for chest pain.  Gastrointestinal: Positive for nausea and vomiting. Negative for abdominal pain, constipation and diarrhea.  Genitourinary: Negative for dysuria, flank pain, frequency, pelvic pain, urgency, vaginal bleeding and vaginal discharge.  Neurological: Negative for dizziness, weakness, light-headedness and headaches.   Physical Exam   Blood pressure 131/77, pulse (!) 117, temperature 98.5 F (36.9 C), temperature  source Oral, resp. rate 16, SpO2 98 %.  Patient Vitals for the past 24 hrs:  BP Temp Temp src Pulse Resp SpO2  09/01/19 1200 131/77 -- -- (!) 117 -- --  09/01/19 1115 (!) 141/114 -- -- (!) 108 -- --  09/01/19 1100 131/84 -- -- (!) 107 -- 98 %  09/01/19 1056 (!) 148/87 98.5 F (36.9 C) Oral (!) 117 16 --   Physical Exam Vitals and nursing note reviewed.  Constitutional:      General: She is not in acute distress.    Appearance: Normal appearance. She is obese. She is not ill-appearing, toxic-appearing or diaphoretic.  HENT:     Head: Normocephalic and atraumatic.  Pulmonary:     Effort: Pulmonary effort is normal.  Neurological:     Mental Status: She is alert and oriented to person,  place, and time.  Psychiatric:        Mood and Affect: Mood normal.        Behavior: Behavior normal.        Thought Content: Thought content normal.        Judgment: Judgment normal.    Results for orders placed or performed during the hospital encounter of 09/01/19 (from the past 24 hour(s))  CBC     Status: Abnormal   Collection Time: 09/01/19 11:04 AM  Result Value Ref Range   WBC 12.9 (H) 4.0 - 10.5 K/uL   RBC 4.52 3.87 - 5.11 MIL/uL   Hemoglobin 12.5 12.0 - 15.0 g/dL   HCT 38.4 36 - 46 %   MCV 85.0 80.0 - 100.0 fL   MCH 27.7 26.0 - 34.0 pg   MCHC 32.6 30.0 - 36.0 g/dL   RDW 12.9 11.5 - 15.5 %   Platelets 223 150 - 400 K/uL   nRBC 0.0 0.0 - 0.2 %  Comprehensive metabolic panel     Status: Abnormal   Collection Time: 09/01/19 11:04 AM  Result Value Ref Range   Sodium 134 (L) 135 - 145 mmol/L   Potassium 3.9 3.5 - 5.1 mmol/L   Chloride 104 98 - 111 mmol/L   CO2 20 (L) 22 - 32 mmol/L   Glucose, Bld 104 (H) 70 - 99 mg/dL   BUN <5 (L) 6 - 20 mg/dL   Creatinine, Ser 0.50 0.44 - 1.00 mg/dL   Calcium 9.2 8.9 - 10.3 mg/dL   Total Protein 6.4 (L) 6.5 - 8.1 g/dL   Albumin 2.7 (L) 3.5 - 5.0 g/dL   AST 18 15 - 41 U/L   ALT 21 0 - 44 U/L   Alkaline Phosphatase 152 (H) 38 - 126 U/L   Total Bilirubin 0.4 0.3 - 1.2 mg/dL   GFR calc non Af Amer >60 >60 mL/min   GFR calc Af Amer >60 >60 mL/min   Anion gap 10 5 - 15  Protein / creatinine ratio, urine     Status: None   Collection Time: 09/01/19 11:20 AM  Result Value Ref Range   Creatinine, Urine 177.03 mg/dL   Total Protein, Urine 20 mg/dL   Protein Creatinine Ratio 0.11 0.00 - 0.15 mg/mg[Cre]  Urinalysis, Routine w reflex microscopic Urine, Clean Catch     Status: Abnormal   Collection Time: 09/01/19 11:20 AM  Result Value Ref Range   Color, Urine AMBER (A) YELLOW   APPearance CLOUDY (A) CLEAR   Specific Gravity, Urine 1.014 1.005 - 1.030   pH 6.0 5.0 - 8.0   Glucose, UA NEGATIVE NEGATIVE mg/dL  Hgb urine dipstick NEGATIVE  NEGATIVE   Bilirubin Urine NEGATIVE NEGATIVE   Ketones, ur NEGATIVE NEGATIVE mg/dL   Protein, ur NEGATIVE NEGATIVE mg/dL   Nitrite NEGATIVE NEGATIVE   Leukocytes,Ua MODERATE (A) NEGATIVE   RBC / HPF 0-5 0 - 5 RBC/hpf   WBC, UA 21-50 0 - 5 WBC/hpf   Bacteria, UA MANY (A) NONE SEEN   Squamous Epithelial / LPF 11-20 0 - 5   Mucus PRESENT    Non Squamous Epithelial 0-5 (A) NONE SEEN    MAU Course  Procedures  MDM -preeclampsia evaluation with single severe range BP in MAU on admission -symptoms include: N/V, visual disturbance -UA: amber/cloudy/mod leuks/many bacteria, sending urine for culture -CBC: H/H 12.5/38.4, platelets 223 -CMP: serum creatinine 0.50, AST/ALT 18/21 -PCr: 0.11 -EFM: reactive       -baseline: 145       -variability: moderate       -accels: present, 15x15       -decels: absent       -TOCO: few ctx, irritability, pt describes as pressure -called and spoke with Dr. Landry Mellow, will perform C/S today -[place]: frank breech -prepare for OR  Orders Placed This Encounter  Procedures  . Culture, OB Urine    Standing Status:   Standing    Number of Occurrences:   1  . SARS Coronavirus 2 by RT PCR (hospital order, performed in [facility] hospital lab) Nasopharyngeal Nasopharyngeal Swab    Standing Status:   Standing    Number of Occurrences:   1    Order Specific Question:   Is this test for diagnosis or screening    Answer:   Screening    Order Specific Question:   Symptomatic for COVID-19 as defined by CDC    Answer:   No    Order Specific Question:   Hospitalized for COVID-19    Answer:   No    Order Specific Question:   Admitted to ICU for COVID-19    Answer:   No    Order Specific Question:   Previously tested for COVID-19    Answer:   No    Order Specific Question:   Resident in a congregate (group) care setting    Answer:   No    Order Specific Question:   Employed in healthcare setting    Answer:   No    Order Specific Question:   Pregnant    Answer:    Yes    Order Specific Question:   Has patient completed COVID vaccination(s) (2 doses of Pfizer/Moderna 1 dose of [employer])    Answer:   Yes  . CBC    Standing Status:   Standing    Number of Occurrences:   1  . Comprehensive metabolic panel    Standing Status:   Standing    Number of Occurrences:   1  . Protein / creatinine ratio, urine    Standing Status:   Standing    Number of Occurrences:   1  . Urinalysis, Routine w reflex microscopic    Standing Status:   Standing    Number of Occurrences:   1  . RPR    Standing Status:   Standing    Number of Occurrences:   1  . Diet NPO time specified    Standing Status:   Standing    Number of Occurrences:   1  . Initiate Pre-op Protocol    Standing Status:   Standing  Number of Occurrences:   1  . Clip operative site    Standing Status:   Standing    Number of Occurrences:   1    Order Specific Question:   Open Abdominal Procedure:    Answer:   clip hairline to labial split  . Place and maintain sequential compression device    Standing Status:   Standing    Number of Occurrences:   1    Order Specific Question:   Laterality    Answer:   Bilateral  . Informed Consent Details: Physician/Practitioner Attestation; Transcribe to consent form and obtain patient signature    Standing Status:   Standing    Number of Occurrences:   1    Order Specific Question:   Physician/Practitioner attestation of informed consent for procedure/surgical case    Answer:   I, the physician/practitioner, attest that I have discussed with the patient the benefits, risks, side effects, alternatives, likelihood of achieving goals and potential problems during recovery for the procedure that I have provided informed consent.    Order Specific Question:   Procedure    Answer:   Primary Cesarean section    Order Specific Question:   Physician/Practitioner performing the procedure    Answer:   Dr. Christophe Louis    Order Specific Question:    Indication/Reason    Answer:   Breech presentation/ Preeclampsia with severe features  . Verify informed consent    Standing Status:   Standing    Number of Occurrences:   1  . Place and maintain sequential compression device    Standing Status:   Standing    Number of Occurrences:   1  . Notify Physician    Confirmatory reading of BP> 160/110 15 minutes later    Standing Status:   Standing    Number of Occurrences:   1    Order Specific Question:   Notify Physician    Answer:   Temp greater than or equal to 100.4    Order Specific Question:   Notify Physician    Answer:   RR greater than 24 or less than 10    Order Specific Question:   Notify Physician    Answer:   HR greater than 120 or less than 50    Order Specific Question:   Notify Physician    Answer:   SBP greater than 160 mmHG or less than 80 mmHG    Order Specific Question:   Notify Physician    Answer:   DBP greater than 110 mmHG or less than 45 mmHG    Order Specific Question:   Notify Physician    Answer:   Urinary output is less than 1104ml for any 4 hour period  . Fetal monitoring    Standing Status:   Standing    Number of Occurrences:   1  . Measure blood pressure    20 minutes after giving hydralazine 10 MG IV dose.  Call MD if SBP >/= 160 or DBP >/= 110.    Standing Status:   Standing    Number of Occurrences:   1  . Type and screen [place]     Standing Status:   Standing    Number of Occurrences:   1  . Insert peripheral IV    #18 guage    Standing Status:   Standing    Number of Occurrences:   1  . Admit  to Inpatient (patient's expected length of stay will be greater than 2 midnights or inpatient only procedure)    Standing Status:   Standing    Number of Occurrences:   1    Order Specific Question:   Hospital Area    Answer:   [place] [[id number]]    Order Specific Question:   Level of Care    Answer:   Labor and Delivery [101]     Order Specific Question:   Covid Evaluation    Answer:   Asymptomatic Screening Protocol (No Symptoms)    Order Specific Question:   Diagnosis    Answer:   Preeclampsia, severe, third trimester [935701]    Order Specific Question:   Admitting Physician    Answer:   Christophe Louis [[id number]]    Order Specific Question:   Attending Physician    Answer:   Christophe Louis [[id number]]    Order Specific Question:   Estimated length of stay    Answer:   past midnight tomorrow    Order Specific Question:   Certification:    Answer:   I certify this patient will need inpatient services for at least 2 midnights   Meds ordered this encounter  Medications  . sodium citrate-citric acid (ORACIT) solution 30 mL  . ceFAZolin (ANCEF) 3 g in dextrose 5 % 50 mL IVPB    Order Specific Question:   Indication:    Answer:   Surgical Prophylaxis  . AND Linked Order Group   . labetalol (NORMODYNE) injection 20 mg   . labetalol (NORMODYNE) injection 40 mg   . labetalol (NORMODYNE) injection 80 mg   . hydrALAZINE (APRESOLINE) injection 10 mg  . magnesium bolus via infusion 4 g  . magnesium sulfate 40 grams in SWI 1000 mL OB infusion    Assessment and Plan   1. Severe preeclampsia, third trimester   2. [redacted] weeks gestation of pregnancy   3. NST (non-stress test) reactive   4. Breech presentation, single or unspecified fetus    -prepare for OR  Gerrie Nordmann Angelos Wasco 09/01/2019, 12:09 PM

## 2019-09-01 NOTE — H&P (Signed)
Kimberly Prince is a 29 y.o. female presenting for cesarean section due to preclampsia with severe features based on severe range bp in mau and visual disturbances. PCR 381 on 8/12. Pregnancy also complicated by breech presentation and maternal obesity. Prenatal care provided by Dr. Christophe Louis with [facility]  OB History    Gravida  1   Para      Term      Preterm      AB      Living        SAB      TAB      Ectopic      Multiple      Live Births             Past Medical History:  Diagnosis Date  . Anxiety    Past Surgical History:  Procedure Laterality Date  . anastamosis of colon     as an infant   Family History: family history is not on file. Social History:  reports that she has never smoked. She has never used smokeless tobacco. She reports previous alcohol use. She reports that she does not use drugs.     Maternal Diabetes: No Genetic Screening: Normal Maternal Ultrasounds/Referrals: Normal Fetal Ultrasounds or other Referrals:  None Maternal Substance Abuse:  No Significant Maternal Medications:  None Significant Maternal Lab Results:  Group B Strep positive Other Comments:  None  Review of Systems  Constitutional: Negative.   HENT: Negative.   Eyes: Positive for visual disturbance.  Respiratory: Negative.   Cardiovascular: Negative.   Gastrointestinal: Negative.   Endocrine: Negative.   Genitourinary: Negative.   Musculoskeletal: Negative.   Skin: Negative.   Allergic/Immunologic: Negative.   Neurological: Negative.   Hematological: Negative.   Psychiatric/Behavioral: Negative.    History   Blood pressure 115/76, pulse (!) 117, temperature 98.5 F (36.9 C), temperature source Oral, resp. rate 16, height 5\' 6"  (1.676 m), weight 134.3 kg, SpO2 98 %. Maternal Exam:  Introitus: Normal vulva.   Physical Exam Vitals reviewed.  Constitutional:      Appearance: Normal appearance.  HENT:     Head: Normocephalic and atraumatic.     Nose:  Nose normal.     Mouth/Throat:     Mouth: Mucous membranes are moist.  Eyes:     Pupils: Pupils are equal, round, and reactive to light.  Cardiovascular:     Rate and Rhythm: Normal rate and regular rhythm.     Pulses: Normal pulses.  Pulmonary:     Effort: Pulmonary effort is normal.     Breath sounds: Normal breath sounds.  Abdominal:     Tenderness: There is abdominal tenderness.  Genitourinary:    General: Normal vulva.  Musculoskeletal:        General: Swelling present. Normal range of motion.     Cervical back: Normal range of motion and neck supple.  Skin:    General: Skin is warm and dry.  Neurological:     General: No focal deficit present.     Mental Status: She is alert and oriented to person, place, and time.  Psychiatric:        Mood and Affect: Mood normal.        Behavior: Behavior normal.     Prenatal labs: ABO, Rh: --/--/A POS (08/16 1104) Antibody: NEG (08/16 1104) Rubella: Immune (01/04 0000) RPR: Nonreactive (01/04 0000)  HBsAg: Negative (01/04 0000)  HIV: Non-reactive (01/04 0000)  GBS:  Positive   Assessment/Plan: 38  wks and 4 days with preeclampsia with severe features based on severe range bp and visual disturbances. Recommend delivery..Magnesium sulfate for seizure prophylaxis for  24 hours post delivery   Breech presentation recommend cesarean section . R/B/A of cesarean section discussed with the patient including but not limited to infection bleeding damage to bowel bladder baby with the need for further surgery. Pt voiced understanding and desires to proceed.   Christophe Louis 09/01/2019, 1:15 PM

## 2019-09-02 ENCOUNTER — Other Ambulatory Visit (HOSPITAL_COMMUNITY)
Admission: RE | Admit: 2019-09-02 | Discharge: 2019-09-02 | Disposition: A | Discharge status: Home / Self Care | Payer: BC Managed Care – PPO | Source: Ambulatory Visit | Attending: Obstetrics and Gynecology | Admitting: Obstetrics and Gynecology

## 2019-09-02 DIAGNOSIS — Z01818 Encounter for other preprocedural examination: Secondary | ICD-10-CM | POA: Insufficient documentation

## 2019-09-02 LAB — COMPREHENSIVE METABOLIC PANEL
ALT: 18 U/L (ref 0–44)
AST: 24 U/L (ref 15–41)
Albumin: 2.2 g/dL — ABNORMAL LOW (ref 3.5–5.0)
Alkaline Phosphatase: 118 U/L (ref 38–126)
Anion gap: 9 (ref 5–15)
BUN: <5 mg/dL — ABNORMAL LOW (ref 6–20)
CO2: 23 mmol/L (ref 22–32)
Calcium: 8 mg/dL — ABNORMAL LOW (ref 8.9–10.3)
Chloride: 101 mmol/L (ref 98–111)
Creatinine, Ser: 0.52 mg/dL (ref 0.44–1.00)
GFR calc Af Amer: >60 mL/min (ref >60)
GFR calc non Af Amer: >60 mL/min (ref >60)
Glucose, Bld: 114 mg/dL — ABNORMAL HIGH (ref 70–99)
Potassium: 4.3 mmol/L (ref 3.5–5.1)
Sodium: 133 mmol/L — ABNORMAL LOW (ref 135–145)
Total Bilirubin: 0.6 mg/dL (ref 0.3–1.2)
Total Protein: 5.4 g/dL — ABNORMAL LOW (ref 6.5–8.1)

## 2019-09-02 LAB — CULTURE, OB URINE: Culture: >=100000 — AB

## 2019-09-02 LAB — CBC
HCT: 30.3 % — ABNORMAL LOW (ref 36.0–46.0)
Hemoglobin: 9.9 g/dL — ABNORMAL LOW (ref 12.0–15.0)
MCH: 27.9 pg (ref 26.0–34.0)
MCHC: 32.7 g/dL (ref 30.0–36.0)
MCV: 85.4 fL (ref 80.0–100.0)
Platelets: 211 10*3/uL (ref 150–400)
RBC: 3.55 MIL/uL — ABNORMAL LOW (ref 3.87–5.11)
RDW: 12.8 % (ref 11.5–15.5)
WBC: 16.9 10*3/uL — ABNORMAL HIGH (ref 4.0–10.5)
nRBC: 0 % (ref 0.0–0.2)

## 2019-09-02 LAB — MAGNESIUM: Magnesium: 4.7 mg/dL — ABNORMAL HIGH (ref 1.7–2.4)

## 2019-09-02 NOTE — Progress Notes (Signed)
Subjective: Postpartum Day 1: Cesarean Delivery Patient reports incisional pain, tolerating PO, + flatus and no problems voiding.    Objective: Vital signs in last 24 hours: Temp:  [98 F (36.7 C)-99.1 F (37.3 C)] 99.1 F (37.3 C) (08/17 1600) Pulse Rate:  [72-111] 84 (08/17 1600) Resp:  [15-20] 18 (08/17 1600) BP: (97-127)/(41-69) 101/57 (08/17 1600) SpO2:  [97 %-100 %] 99 % (08/17 1600)  Physical Exam:  General: alert, cooperative and no distress Lochia: appropriate Uterine Fundus: firm Incision: healing well DVT Evaluation: No evidence of DVT seen on physical exam.  Recent Labs    09/01/19 1104 09/02/19 0446  HGB 12.5 9.9*  HCT 38.4 30.3*    Assessment/Plan: Status post Cesarean section. Doing well postoperatively.  Preeclampsia with severe features .. pt is doing well s/p 24 hours magnesium sulfate will monitor bp closely  Lactation consultation in progress during visit  Encouraged ambulation  .  Christophe Louis 09/02/2019, 6:19 PM

## 2019-09-02 NOTE — Progress Notes (Signed)
MOB was referred for history of depression/anxiety. * Referral screened out by Clinical Social Worker because none of the following criteria appear to apply: ~ History of anxiety/depression during this pregnancy, or of post-partum depression. ~ Diagnosis of anxiety and/or depression within last 3 years OR * MOB's symptoms currently being treated with medication and/or therapy. MOB has an active Rx for Zoloft and no concerns noted in OB records.  MOB's Edinburgh score was 1.  Please contact the Clinical Social Worker if needs arise, or if MOB requests.  [name], MSW, LCSW Clinical Social Work [phone]

## 2019-09-02 NOTE — Lactation Note (Signed)
This note was copied from a baby's chart. Lactation Consultation Note  Patient Name: Kimberly Prince [id number] Date: 09/02/2019 Reason for consult: Follow-up assessment;Difficult latch;Primapara;1st time breastfeeding;Early term 77-38.6wks  First-time mother eager to breastfeed. Baby was cueing in maternal grandmother's arms upon arrival. Grandmother explained explained pump was set up by RN for stimulation purposes since formula is being used to feed baby. Mother has been on Mg for ~24 hours and was discontinued 3 hours ago. Mother has pumped only once, no colostrum collected.   Reviewed hand expression technique and noted a few drops of colostrum. Attempted latch several times to right breast, football position without success. Baby is very fussy and unable to sustain latch. Mother shared that baby had not fed since noon. Talked about using donor milk already in room to pace bottle feed baby and then try again to offer the breast. Parents agreed. Demonstrated pace bottle feeding, frequent burping and fed ~66mL of DM. Baby spit ~1/4 of teaspoon of curdle milk. Discussed the possibility of using a nipple shield to latch baby. Mother has flat nipples, noted compression stripe on right breast. Mother is willing to try again since baby still showing cues. Latched baby to left breast, football position using a nipple shield with come DM inside. Baby latched after a few attempts and able to transfer DM inside shield.  Retried assisting mother with positioning to attain a deeper latch and nipple shield with DM. Baby was able to sustain a better latch. Identified some swallows, baby was able to take ~55mL of DM. Baby fell asleep, swaddled and got her to basinet. Talked to mother about pumping using initiation setting. Encouraged to pump every time she uses formula/donor milk and feed baby everything she pumps. Mother verbalized agreement.    Encourage to follow hunger cues to feed baby and fullness signs when  supplementing to prevent overfeeding. Reviewed breastfeeding basics. Discussed milk coming to volume. Reviewed ETI behavior and second day expectations with parents and encouraged to contact [facility] for support as needed and recommended to request help for questions or concerns.    All questions answered at this time.   Maternal Data Has patient been taught Hand Expression?: Yes Does the patient have breastfeeding experience prior to this delivery?: No  Feeding Feeding Type: Breast Milk with Donor Milk Nipple Type: Regular  LATCH Score Latch: Repeated attempts needed to sustain latch, nipple held in mouth throughout feeding, stimulation needed to elicit sucking reflex.  Audible Swallowing: A few with stimulation  Type of Nipple: Flat  Comfort (Breast/Nipple): Soft / non-tender  Hold (Positioning): Assistance needed to correctly position infant at breast and maintain latch.  LATCH Score: 6  Interventions Interventions: Breast feeding basics reviewed;Assisted with latch;Skin to skin;Hand express;Adjust position;Support pillows;Position options;DEBP  Lactation Tools Discussed/Used Tools: Nipple Shields;Pump;Bottle Nipple shield size: 20 Breast pump type: Double-Electric Breast Pump   Consult Status Consult Status: Follow-up Date: 09/03/19 Follow-up type: In-patient    [name] [name] 09/02/2019, 7:41 PM

## 2019-09-02 NOTE — Lactation Note (Signed)
This note was copied from a baby's chart. Lactation Consultation Note  Patient Name: Girl Chayna Surratt [id number] Date: 09/02/2019 Reason for consult: Initial assessment;Primapara;Other (Comment);Early term 71-38.6wks (mom on Mag)   Mom on Mag.  P1 desires to BF.  Began supplementing last night with formula.  Infant less than 24 hours old.    Mom has had success latching infant to left side but is unable to get infant latched to right side.  Mom has large breasts that are compressible, nipple tissue is flat and more so on the right side.  Mom had "some breast changes during pregancy", skin darkened, unsure about size change.   Infant was sucking on hands and lying in bassinet when LC entered room.  LC suggested mom placed infant STS and hand expression was reviewed.  Rolled cloths used to support moms breast and infant was placed in football hold.  Infant opened to latched and took a few sucks then slipped out.  Teacup hold was shown to mat. Grandmother and dad so they could assist with sandwiching and compressing the tissue with latch.  After a few sucks, infant had a curdled formula emesis; medium amount.  LC placed infant STS with mom and infant fell asleep with relaxed body.  Mom said infant had formula recently.  Close to 25 mls was missing from bottle.  LC made sure family was aware to throw away the bottle after being open for 1 hour.    Guidelines were provided on supplementation to BF.  Mom states her goal is to provide breastmilk.  LC did discuss donor milk as an option if supplementation was needed.  Mom was encouraged to put infant to the breast with cues.  8-12+times in 24 hours.   DEBP reviewed and initiate setting explained.  21 size flange size fit snuggly and mom felt the 24 was a better fit.  DEBP parts, cleaning, and milk storage explained.  LC used the hand pump and explained to mom the benefit of pre pumping prior to latching in order to evert the nipple tissue to aid in a better  latch.  Discussed possibility of NS use later if infant was unable to sustain latch.   Latch would need to be observed first when infant is alert and hungry to assess the need for the NS.  Plan: Put infant to breast with cues/ STS often/ hand express/ use the DEBP if supplementation is given in order to stimlulate milk supply and collect any colostrum to feed back to infant, and after BF if mom is feeling well enough.    Mom has brochure and is aware of OP services and phone line as well as BFSG and OP LC services.  Maternal Data Has patient been taught Hand Expression?: Yes Does the patient have breastfeeding experience prior to this delivery?: No  Feeding Feeding Type: Breast Fed  LATCH Score Latch: Too sleepy or reluctant, no latch achieved, no sucking elicited.  Audible Swallowing: None  Type of Nipple: Flat  Comfort (Breast/Nipple): Soft / non-tender  Hold (Positioning): Assistance needed to correctly position infant at breast and maintain latch.  LATCH Score: 4  Interventions Interventions: Breast feeding basics reviewed;Skin to skin;Breast massage;Hand express;Support pillows;DEBP  Lactation Tools Discussed/Used Pump Review: Setup, frequency, and cleaning   Consult Status Consult Status: Follow-up Date: 09/03/19 Follow-up type: In-patient    Ferne Coe [facility] 09/02/2019, 2:37 PM

## 2019-09-03 ENCOUNTER — Encounter (HOSPITAL_COMMUNITY): Payer: Self-pay | Admitting: Obstetrics and Gynecology

## 2019-09-03 NOTE — Progress Notes (Signed)
Subjective: Postpartum Day 2: Cesarean Delivery Patient reports incisional pain, tolerating PO, + flatus and no problems voiding.    Objective: Vital signs in last 24 hours: Temp:  [97.5 F (36.4 C)-99.1 F (37.3 C)] 98 F (36.7 C) (08/18 1142) Pulse Rate:  [62-84] 67 (08/18 1154) Resp:  [14-21] 16 (08/18 1142) BP: (101-122)/(36-92) 109/53 (08/18 1154) SpO2:  [94 %-100 %] 97 % (08/18 1142)  Physical Exam:  General: alert, cooperative and no distress Lochia: appropriate Uterine Fundus: firm Incision: healing well DVT Evaluation: No evidence of DVT seen on physical exam.  Recent Labs    09/01/19 1104 09/02/19 0446  HGB 12.5 9.9*  HCT 38.4 30.3*    Assessment/Plan: Status post Cesarean section. Doing well postoperatively.  Preeclampsia with severe features/ pt doing well s/p magnesium bp is normal  Will continue to monitor closely  Routine postpartum care  Plan discharge home tomorrow .  Christophe Louis 09/03/2019, 2:01 PM

## 2019-09-03 NOTE — Lactation Note (Signed)
This note was copied from a baby's chart. Lactation Consultation Note  Patient Name: Kimberly Prince [id number]'[id number] Date: 09/03/2019 Reason for consult: Follow-up assessment;Difficult latch;Primapara;1st time breastfeeding;Early term 37-38.6wks  LC in to visit with P5 Mom of ET infant at 65 hrs old.  Infant at 3% weight loss.  Mom has struggled with latching baby and opted for formula supplementation rather than donor breast milk.  Mom pumped once last night.  Baby had just been fed formula by bottle and baby lying prone on Mom's chest over her nightgown.    Empathized with Mom about the difficulties she has been having with latching baby.  Tried to reassure her that consistent pumping will help to establish a full milk supply and leave the option open for OP lactation assistance.  Mom seems tired and discouraged, but appreciated understanding that no decision needs to be made regarding breastfeeding her baby.    Encouraged STS against her bare chest, and frequent hand expression and double pumping.  Mom has a Spectra DEBP at home.    Mom denies any questions currently and encouraged her to ask her RN for help latching prn.  RN aware to call LC prn. Interventions Interventions: Breast feeding basics reviewed;Skin to skin;Breast massage;Hand express;DEBP  Lactation Tools Discussed/Used Tools: Pump Breast pump type: Double-Electric Breast Pump   Consult Status Consult Status: Follow-up Date: 09/04/19 Follow-up type: In-patient    Broadus John 09/03/2019, 8:24 AM

## 2019-09-04 LAB — RPR: RPR Ser Ql: NONREACTIVE — AB

## 2019-09-04 MED ORDER — OXYCODONE HCL 5 MG PO TABS: 5.0000 mg | ORAL_TABLET | ORAL | 0 refills | Status: AC | PRN

## 2019-09-04 MED ORDER — IBUPROFEN 800 MG PO TABS: 800.0000 mg | ORAL_TABLET | Freq: Three times a day (TID) | ORAL | 0 refills | Status: DC | PRN

## 2019-09-04 NOTE — Lactation Note (Signed)
This note was copied from a baby's chart. Lactation Consultation Note  Patient Name: Kimberly Prince [id number] Date: 09/04/2019 Reason for consult: Follow-up assessment;Primapara;1st time breastfeeding;Early term 37-38.6wks  LC in to visit with P1 Mom of ET infant on day of discharge.  Baby 79 hrs old and at 5% weight loss.  Due to difficulty latching baby, Mom has opted for pumping and supplementing by bottle.  Last pumping, Mom expressed 40 ml.  Mom is feeling much better this am.  Mom has a DEBP (Spectra) at home.   Mom will call if she would like assistance latching baby to the breast, as she is aware of OP Lactation support available to her.   Encouraged STS and cue based feedings.  Encouraged double pumping every 2-3 hrs during the day and 3-4 hrs at night.   Engorgement prevention and treatment reviewed. Flanges changed to 27 mm for a better fit.   Reviewed washing disassembled pump parts. Encouraged Mom to call prn.   Interventions Interventions: Breast feeding basics reviewed;Skin to skin;Breast massage;Hand express;DEBP  Lactation Tools Discussed/Used Tools: Pump;Bottle;Flanges Flange Size: 27 Breast pump type: Double-Electric Breast Pump   Consult Status Consult Status: Complete Date: 09/04/19 Follow-up type: Call as needed    Kimberly Prince 09/04/2019, 8:13 AM

## 2019-09-04 NOTE — Anesthesia Postprocedure Evaluation (Signed)
Anesthesia Post Note  Patient: Kimberly Prince  Procedure(s) Performed: CESAREAN SECTION-REQUEST RNFA (N/A )     Patient location during evaluation: PACU Anesthesia Type: Spinal Level of consciousness: awake Pain management: pain level controlled Vital Signs Assessment: post-procedure vital signs reviewed and stable Respiratory status: spontaneous breathing Cardiovascular status: stable Postop Assessment: no headache, no backache, spinal receding, patient able to bend at knees and no apparent nausea or vomiting Anesthetic complications: no   No complications documented.  Last Vitals:  Vitals:   09/04/19 0435 09/04/19 0806  BP: 112/69 112/61  Pulse: 66 68  Resp: 18   Temp: 36.9 C 36.7 C  SpO2: 98% 99%    Last Pain:  Vitals:   09/04/19 1147  TempSrc:   PainSc: 0-No pain                 Huston Foley

## 2019-09-04 NOTE — Plan of Care (Signed)
  Problem: Health Behavior/Discharge Planning: Goal: Ability to manage health-related needs will improve 09/04/2019 0928 by Vania Rea, RN Outcome: Progressing 09/04/2019 0926 by Vania Rea, RN Outcome: Progressing   Problem: Clinical Measurements: Goal: Will remain free from infection 09/04/2019 0928 by Vania Rea, RN Outcome: Progressing 09/04/2019 0926 by Vania Rea, RN Outcome: Progressing   Problem: Activity: Goal: Risk for activity intolerance will decrease 09/04/2019 0928 by Vania Rea, RN Outcome: Progressing 09/04/2019 0926 by Vania Rea, RN Outcome: Progressing   Problem: Nutrition: Goal: Adequate nutrition will be maintained 09/04/2019 0928 by Vania Rea, RN Outcome: Progressing 09/04/2019 0926 by Vania Rea, RN Outcome: Progressing   Problem: Coping: Goal: Level of anxiety will decrease 09/04/2019 0928 by Vania Rea, RN Outcome: Progressing 09/04/2019 0926 by Vania Rea, RN Outcome: Progressing   Problem: Elimination: Goal: Will not experience complications related to bowel motility 09/04/2019 0928 by Vania Rea, RN Outcome: Progressing 09/04/2019 0926 by Vania Rea, RN Outcome: Progressing Goal: Will not experience complications related to urinary retention 09/04/2019 0928 by Vania Rea, RN Outcome: Progressing 09/04/2019 0926 by Vania Rea, RN Outcome: Progressing   Problem: Pain Managment: Goal: General experience of comfort will improve 09/04/2019 0928 by Vania Rea, RN Outcome: Progressing 09/04/2019 0926 by Vania Rea, RN Outcome: Progressing   Problem: Safety: Goal: Ability to remain free from injury will improve 09/04/2019 0928 by Vania Rea, RN Outcome: Progressing 09/04/2019 0926 by Vania Rea, RN Outcome: Progressing   Problem: Skin Integrity: Goal: Risk for impaired skin integrity will  decrease 09/04/2019 0928 by Vania Rea, RN Outcome: Progressing 09/04/2019 0926 by Vania Rea, RN Outcome: Progressing

## 2019-09-08 NOTE — Discharge Summary (Signed)
Postpartum Discharge Summary  Date of Service updated 09/04/2019     Patient Name: Kimberly Prince DOB: 07-Dec-1990 MRN: [id number]  Date of admission: 09/01/2019 Delivery date:09/01/2019  Delivering provider: Christophe Louis  Date of discharge:09/04/2019 Admitting diagnosis: Preeclampsia, severe, third trimester [O14.13] Intrauterine pregnancy: [redacted]w[redacted]d    Secondary diagnosis:  Active Problems:   Preeclampsia, severe, third trimester  Additional problems: Morbid obesity     Discharge diagnosis: Term Pregnancy Delivered and Preeclampsia (severe)                                              Post partum procedures:magnesium sulfate IV  Augmentation: N/A Complications: None  Hospital course: Sceduled C/S   29y.o. yo G1P1001 at 356w4das admitted to the hospital 09/01/2019 for scheduled cesarean section with the following indication:Malpresentation.Delivery details are as follows:  Membrane Rupture Time/Date: 2:38 PM ,09/01/2019   Delivery Method:C-Section, Low Transverse  Details of operation can be found in separate operative note.  Patient had an uncomplicated postpartum course.  She is ambulating, tolerating a regular diet, passing flatus, and urinating well. Patient is discharged home in stable condition on  09/08/19        Newborn Data: Birth date:09/01/2019  Birth time:2:38 PM  Gender:Female  Living status:Living  Apgars:9 ,9  Weight:3510 g     Magnesium Sulfate received: Yes: Seizure prophylaxis BMZ received: No Rhophylac:No MMR:No T-DaP:Given prenatally Flu: N/A Transfusion:No  Physical exam  Vitals:   09/03/19 1947 09/04/19 0032 09/04/19 0435 09/04/19 0806  BP: (!) 120/56 119/60 112/69 112/61  Pulse: 70 71 66 68  Resp: '18 16 18   ' Temp: 98.1 F (36.7 C) 98.2 F (36.8 C) 98.5 F (36.9 C) 98 F (36.7 C)  TempSrc: Oral Oral Oral Oral  SpO2: 98% 98% 98% 99%  Weight:      Height:       General: alert, cooperative and no distress Lochia: appropriate Uterine Fundus:  firm Incision: Healing well with no significant drainage DVT Evaluation: No evidence of DVT seen on physical exam. Labs: Lab Results  Component Value Date   WBC 16.9 (H) 09/02/2019   HGB 9.9 (L) 09/02/2019   HCT 30.3 (L) 09/02/2019   MCV 85.4 09/02/2019   PLT 211 09/02/2019   CMP Latest Ref Rng & Units 09/02/2019  Glucose 70 - 99 mg/dL 114(H)  BUN 6 - 20 mg/dL <5(L)  Creatinine 0.44 - 1.00 mg/dL 0.52  Sodium 135 - 145 mmol/L 133(L)  Potassium 3.5 - 5.1 mmol/L 4.3  Chloride 98 - 111 mmol/L 101  CO2 22 - 32 mmol/L 23  Calcium 8.9 - 10.3 mg/dL 8.0(L)  Total Protein 6.5 - 8.1 g/dL 5.4(L)  Total Bilirubin 0.3 - 1.2 mg/dL 0.6  Alkaline Phos 38 - 126 U/L 118  AST 15 - 41 U/L 24  ALT 0 - 44 U/L 18   Edinburgh Score: Edinburgh Postnatal Depression Scale Screening Tool 09/01/2019  I have been able to laugh and see the funny side of things. 0  I have looked forward with enjoyment to things. 0  I have blamed myself unnecessarily when things went wrong. 0  I have been anxious or worried for no good reason. 0  I have felt scared or panicky for no good reason. 0  Things have been getting on top of me. 1  I have been so unhappy  that I have had difficulty sleeping. 0  I have felt sad or miserable. 0  I have been so unhappy that I have been crying. 0  The thought of harming myself has occurred to me. 0  Edinburgh Postnatal Depression Scale Total 1      After visit meds:  Allergies as of 09/04/2019      Reactions   Sulfa Antibiotics Hives      Medication List    TAKE these medications   acetaminophen 500 MG tablet Commonly known as: TYLENOL Take 1,000 mg by mouth every 6 (six) hours as needed for mild pain or headache.   cetirizine 10 MG tablet Commonly known as: ZYRTEC Take 10 mg by mouth at bedtime.   ibuprofen 800 MG tablet Commonly known as: ADVIL Take 1 tablet (800 mg total) by mouth every 8 (eight) hours as needed.   oxyCODONE 5 MG immediate release tablet Commonly  known as: Oxy IR/ROXICODONE Take 1-2 tablets (5-10 mg total) by mouth every 4 (four) hours as needed for up to 7 days for moderate pain.   prenatal multivitamin Tabs tablet Take 1 tablet by mouth at bedtime.   sertraline 50 MG tablet Commonly known as: ZOLOFT Take 50 mg by mouth at bedtime.   Tylenol PM Extra Strength 25-500 MG Tabs tablet Generic drug: diphenhydramine-acetaminophen Take 1 tablet by mouth at bedtime as needed for up to 7 doses.        Discharge home in stable condition Infant Feeding: Breast Infant Disposition:home with mother Discharge instruction: per After Visit Summary and Postpartum booklet. Activity: Advance as tolerated. Pelvic rest for 6 weeks.  Diet: routine diet Anticipated Birth Control: Unsure Postpartum Appointment:1 week Additional Postpartum F/U: Postpartum Depression checkup Future Appointments:No future appointments. Follow up Visit:  Follow-up Information    Christophe Louis, MD. Schedule an appointment as soon as possible for a visit in 1 week(s).   Specialty: Obstetrics and Gynecology Why: please make an appointment in 1 week for nurse visit to check you blood pressure in Dr. Sundra Aland office.. make an appoinment for an incision check with Dr. Landry Mellow in 2 weeks  Contact information: [address] [place] [address] [phone]                   09/08/2019 Christophe Louis, MD

## 2019-10-08 DIAGNOSIS — N939 Abnormal uterine and vaginal bleeding, unspecified: Secondary | ICD-10-CM | POA: Diagnosis not present

## 2019-10-23 DIAGNOSIS — Z20822 Contact with and (suspected) exposure to covid-19: Secondary | ICD-10-CM | POA: Diagnosis not present

## 2019-11-05 DIAGNOSIS — Z23 Encounter for immunization: Secondary | ICD-10-CM | POA: Diagnosis not present

## 2019-11-05 DIAGNOSIS — F5081 Binge eating disorder: Secondary | ICD-10-CM | POA: Diagnosis not present

## 2019-11-05 DIAGNOSIS — F411 Generalized anxiety disorder: Secondary | ICD-10-CM | POA: Diagnosis not present

## 2019-11-05 DIAGNOSIS — Z6841 Body Mass Index (BMI) 40.0 and over, adult: Secondary | ICD-10-CM | POA: Diagnosis not present

## 2020-07-19 ENCOUNTER — Encounter (HOSPITAL_COMMUNITY): Payer: Self-pay | Admitting: Obstetrics and Gynecology

## 2020-07-19 ENCOUNTER — Other Ambulatory Visit: Payer: Self-pay

## 2020-07-19 ENCOUNTER — Inpatient Hospital Stay (HOSPITAL_COMMUNITY)
Admission: AD | Admit: 2020-07-19 | Discharge: 2020-07-19 | Disposition: A | Discharge status: Home / Self Care | Payer: BC Managed Care – PPO | Attending: Obstetrics and Gynecology | Admitting: Obstetrics and Gynecology

## 2020-07-19 ENCOUNTER — Inpatient Hospital Stay (HOSPITAL_COMMUNITY): Payer: BC Managed Care – PPO

## 2020-07-19 ENCOUNTER — Other Ambulatory Visit: Payer: Self-pay | Admitting: Advanced Practice Midwife

## 2020-07-19 DIAGNOSIS — Z3A12 12 weeks gestation of pregnancy: Secondary | ICD-10-CM | POA: Insufficient documentation

## 2020-07-19 DIAGNOSIS — O3411 Maternal care for benign tumor of corpus uteri, first trimester: Secondary | ICD-10-CM

## 2020-07-19 DIAGNOSIS — O021 Missed abortion: Secondary | ICD-10-CM | POA: Insufficient documentation

## 2020-07-19 DIAGNOSIS — O209 Hemorrhage in early pregnancy, unspecified: Secondary | ICD-10-CM

## 2020-07-19 HISTORY — DX: Gestational (pregnancy-induced) hypertension without significant proteinuria, unspecified trimester: O13.9

## 2020-07-19 HISTORY — DX: Benign neoplasm of connective and other soft tissue, unspecified: D21.9

## 2020-07-19 LAB — URINALYSIS, ROUTINE W REFLEX MICROSCOPIC
Bacteria, UA: NONE SEEN
Bilirubin Urine: NEGATIVE
Glucose, UA: NEGATIVE mg/dL
Ketones, ur: NEGATIVE mg/dL
Leukocytes,Ua: NEGATIVE
Nitrite: NEGATIVE
Protein, ur: NEGATIVE mg/dL
Specific Gravity, Urine: 1.016 (ref 1.005–1.030)
pH: 7 (ref 5.0–8.0)

## 2020-07-19 LAB — CBC
HCT: 42.2 % (ref 36.0–46.0)
Hemoglobin: 13.7 g/dL (ref 12.0–15.0)
MCH: 27.3 pg (ref 26.0–34.0)
MCHC: 32.5 g/dL (ref 30.0–36.0)
MCV: 84.1 fL (ref 80.0–100.0)
Platelets: 247 10*3/uL (ref 150–400)
RBC: 5.02 MIL/uL (ref 3.87–5.11)
RDW: 12.7 % (ref 11.5–15.5)
WBC: 6 10*3/uL (ref 4.0–10.5)
nRBC: 0 % (ref 0.0–0.2)

## 2020-07-19 LAB — POCT PREGNANCY, URINE: Preg Test, Ur: POSITIVE — AB

## 2020-07-19 LAB — HCG, QUANTITATIVE, PREGNANCY: hCG, Beta Chain, Quant, S: 3706 m[IU]/mL — ABNORMAL HIGH (ref <5)

## 2020-07-19 MED ORDER — HYDROCODONE-ACETAMINOPHEN 5-325 MG PO TABS
1.0000 | ORAL_TABLET | Freq: Once | ORAL | Status: AC
Start: 2020-07-19 — End: 2020-07-19
  Administered 2020-07-19: 1 via ORAL
  Filled 2020-07-19: qty 1

## 2020-07-19 MED ORDER — PROMETHAZINE HCL 25 MG PO TABS
25.0000 mg | ORAL_TABLET | Freq: Once | ORAL | Status: AC
  Administered 2020-07-19: 25 mg via ORAL
  Filled 2020-07-19: qty 1

## 2020-07-19 MED ORDER — MISOPROSTOL 200 MCG PO TABS
800.0000 ug | ORAL_TABLET | Freq: Once | ORAL | Status: AC
  Administered 2020-07-19: 800 ug via ORAL
  Filled 2020-07-19: qty 4

## 2020-07-19 MED ORDER — IBUPROFEN 600 MG PO TABS: 600.0000 mg | ORAL_TABLET | Freq: Four times a day (QID) | ORAL | 0 refills | Status: DC | PRN

## 2020-07-19 MED ORDER — HYDROCODONE-ACETAMINOPHEN 5-325 MG PO TABS: 2.0000 | ORAL_TABLET | ORAL | 0 refills | Status: DC | PRN

## 2020-07-19 MED ORDER — PROMETHAZINE HCL 12.5 MG PO TABS: 12.5000 mg | ORAL_TABLET | Freq: Four times a day (QID) | ORAL | 0 refills | Status: DC | PRN

## 2020-07-19 NOTE — Progress Notes (Signed)
Patient called MAU to report her original pharmacy was closed for July 4th holiday. Pharmacy updated. Medications resent.  Mallie Snooks, MSN, CNM Certified Nurse Midwife, [employer] for [employer], [employer] 07/19/20 5:14 PM

## 2020-07-19 NOTE — MAU Provider Note (Signed)
History     CSN: [id number]  Arrival date and time: 07/19/20 0940   Event Date/Time   First Provider Initiated Contact with Patient 07/19/20 1025      Chief Complaint  Patient presents with   Vaginal Bleeding   HPI Kimberly Prince is a 30 y.o. G2P1001 at [redacted]w[redacted]d, dating by 8w 2d ultrasound. She presents to MAU with chief complaints of vaginal spotting and abdominal cramping. These are new problems, onset yesterday morning. Patient states her spotting has fluctuated between bright red and dark brown since onset. She denies heavy bleeding. She is only visualizing bleeding when she wipes after voiding. Most recent sexual intercourse 24 hours prior to onset of bleeding.  Patient receives care with [facility].  OB History     Gravida  2   Para  1   Term  1   Preterm      AB      Living  1      SAB      IAB      Ectopic      Multiple  0   Live Births  1           Past Medical History:  Diagnosis Date   Anxiety    Fibroid    Pregnancy induced hypertension    with first pregnancy    Past Surgical History:  Procedure Laterality Date   anastamosis of colon     as an infant   CESAREAN SECTION N/A 09/01/2019   Procedure: CESAREAN SECTION-REQUEST RNFA;  Surgeon: Christophe Louis, MD;  Location: [facility];  Service: Obstetrics;  Laterality: N/A;    Family History  Problem Relation Age of Onset   Healthy Mother    Healthy Father     Social History   Tobacco Use   Smoking status: Never   Smokeless tobacco: Never  Vaping Use   Vaping Use: Never used  Substance Use Topics   Alcohol use: Not Currently   Drug use: Never    Allergies:  Allergies  Allergen Reactions   Sulfa Antibiotics Hives    Medications Prior to Admission  Medication Sig Dispense Refill Last Dose   Prenatal Vit-Fe Fumarate-FA (PRENATAL MULTIVITAMIN) TABS tablet Take 1 tablet by mouth at bedtime.   07/18/2020   acetaminophen (TYLENOL) 500 MG tablet Take 1,000 mg by mouth every 6 (six) hours  as needed for mild pain or headache.      cetirizine (ZYRTEC) 10 MG tablet Take 10 mg by mouth at bedtime.      diphenhydramine-acetaminophen (TYLENOL PM EXTRA STRENGTH) 25-500 MG TABS tablet Take 1 tablet by mouth at bedtime as needed for up to 7 doses. (Patient not taking: No sig reported) 7 tablet 0    ibuprofen (ADVIL) 800 MG tablet Take 1 tablet (800 mg total) by mouth every 8 (eight) hours as needed. 30 tablet 0    sertraline (ZOLOFT) 50 MG tablet Take 50 mg by mouth at bedtime.        Review of Systems  Gastrointestinal:  Positive for abdominal pain.  Genitourinary:  Positive for vaginal bleeding.  All other systems reviewed and are negative. Physical Exam   Blood pressure 114/77, pulse 97, temperature 98.7 F (37.1 C), temperature source Oral, resp. rate 20, height 5\' 5"  (1.651 m), weight 135.7 kg, SpO2 100 %, unknown if currently breastfeeding.  Physical Exam Vitals and nursing note reviewed. Exam conducted with a chaperone present.  Constitutional:      Appearance: Normal appearance.  She is obese. She is not ill-appearing.  Cardiovascular:     Rate and Rhythm: Normal rate.     Pulses: Normal pulses.     Heart sounds: Normal heart sounds.  Pulmonary:     Effort: Pulmonary effort is normal.     Breath sounds: Normal breath sounds.  Abdominal:     Tenderness: There is no abdominal tenderness.  Genitourinary:    Comments: Deferred Skin:    Capillary Refill: Capillary refill takes less than 2 seconds.  Neurological:     Mental Status: She is alert and oriented to person, place, and time.  Psychiatric:        Mood and Affect: Mood normal.        Behavior: Behavior normal.        Thought Content: Thought content normal.        Judgment: Judgment normal.    MAU Course  Procedures  --Unable to obtain FHT with Doppler or bedside ultrasound  Patient Vitals for the past 24 hrs:  BP Temp Temp src Pulse Resp SpO2 Height Weight  07/19/20 1233 132/60 -- -- 81 -- -- -- --   07/19/20 0958 114/77 98.7 F (37.1 C) Oral 97 20 100 % 5\' 5"  (1.651 m) 135.7 kg   Results for orders placed or performed during the hospital encounter of 07/19/20 (from the past 24 hour(s))  Pregnancy, urine POC     Status: Abnormal   Collection Time: 07/19/20  9:52 AM  Result Value Ref Range   Preg Test, Ur POSITIVE (A) NEGATIVE  Urinalysis, Routine w reflex microscopic Urine, Clean Catch     Status: Abnormal   Collection Time: 07/19/20 10:05 AM  Result Value Ref Range   Color, Urine YELLOW YELLOW   APPearance CLEAR CLEAR   Specific Gravity, Urine 1.016 1.005 - 1.030   pH 7.0 5.0 - 8.0   Glucose, UA NEGATIVE NEGATIVE mg/dL   Hgb urine dipstick MODERATE (A) NEGATIVE   Bilirubin Urine NEGATIVE NEGATIVE   Ketones, ur NEGATIVE NEGATIVE mg/dL   Protein, ur NEGATIVE NEGATIVE mg/dL   Nitrite NEGATIVE NEGATIVE   Leukocytes,Ua NEGATIVE NEGATIVE   RBC / HPF 0-5 0 - 5 RBC/hpf   WBC, UA 0-5 0 - 5 WBC/hpf   Bacteria, UA NONE SEEN NONE SEEN   Squamous Epithelial / LPF 0-5 0 - 5   Mucus PRESENT   CBC     Status: None   Collection Time: 07/19/20 10:26 AM  Result Value Ref Range   WBC 6.0 4.0 - 10.5 K/uL   RBC 5.02 3.87 - 5.11 MIL/uL   Hemoglobin 13.7 12.0 - 15.0 g/dL   HCT 42.2 36.0 - 46.0 %   MCV 84.1 80.0 - 100.0 fL   MCH 27.3 26.0 - 34.0 pg   MCHC 32.5 30.0 - 36.0 g/dL   RDW 12.7 11.5 - 15.5 %   Platelets 247 150 - 400 K/uL   nRBC 0.0 0.0 - 0.2 %  hCG, quantitative, pregnancy     Status: Abnormal   Collection Time: 07/19/20 10:26 AM  Result Value Ref Range   hCG, Beta Chain, Quant, S 3,706 (H) <5 mIU/mL   US OB LESS THAN 14 WEEKS WITH OB TRANSVAGINAL  Result Date: 07/19/2020 CLINICAL DATA:  Vaginal bleeding in 1st trimester pregnancy. EXAM: OBSTETRIC <14 WK [place] AND TRANSVAGINAL OB US TECHNIQUE: Both transabdominal and transvaginal ultrasound examinations were performed for complete evaluation of the gestation as well as the maternal uterus, adnexal regions, and pelvic cul-de-sac.  Transvaginal technique was performed to assess early pregnancy. COMPARISON:  None. FINDINGS: Intrauterine gestational sac: Single Yolk sac:  Visualized. Embryo:  Visualized. Cardiac Activity: Not Visualized. CRL:  25 mm   9 w   1 d Subchorionic hemorrhage:  None visualized. Maternal uterus/adnexae: Small right ovarian corpus luteum cyst noted. Left ovary was not directly visualized, however no mass or abnormal free fluid seen. IMPRESSION: Findings meet definitive criteria for failed pregnancy. This follows SRU consensus guidelines: Diagnostic Criteria for Nonviable Pregnancy Early in the First Trimester. Alison Stalling J Med [phone]. Electronically Signed   By: Marlaine Hind M.D.   On: 07/19/2020 11:32     Early Intrauterine Pregnancy Failure  X  Documented intrauterine pregnancy failure less than or equal to [redacted] weeks gestation  X  No serious current illness  X  Baseline Hgb greater than or equal to 10g/dl  X  Patient has easily accessible transportation to the hospital  X Clear preference  X Practitioner/physician deems patient reliable  X Counseling by practitioner or physician  X Patient education by RN  Medication dispensed:  X Cytotec 800 mcg (orally in MAU)    X Ibuprofen 600 mg 1 tablet by mouth every 6 hours as needed #30  X Hydrocodone/acetaminophen 5/325 mg by mouth every 4 to 6 hours as needed  X Phenergan 12.5 mg by mouth every 4 hours as needed for nausea  Meds ordered this encounter  Medications   misoprostol (CYTOTEC) tablet 800 mcg   promethazine (PHENERGAN) tablet 25 mg   HYDROcodone-acetaminophen (NORCO/VICODIN) 5-325 MG per tablet 1 tablet   HYDROcodone-acetaminophen (NORCO/VICODIN) 5-325 MG tablet    Sig: Take 2 tablets by mouth every 4 (four) hours as needed for up to 10 doses.    Dispense:  20 tablet    Refill:  0    Order Specific Question:   Supervising Provider    Answer:   Verita Schneiders A [[id number]]   ibuprofen (ADVIL) 600 MG tablet    Sig: Take 1  tablet (600 mg total) by mouth every 6 (six) hours as needed for up to 30 doses.    Dispense:  30 tablet    Refill:  0    Order Specific Question:   Supervising Provider    Answer:   Verita Schneiders A [[id number]]   promethazine (PHENERGAN) 12.5 MG tablet    Sig: Take 1 tablet (12.5 mg total) by mouth every 6 (six) hours as needed for nausea or vomiting.    Dispense:  30 tablet    Refill:  0    Order Specific Question:   Supervising Provider    Answer:   Verita Schneiders A [[id number]]   Assessment and Plan  --30 y.o. G2P1001 at [redacted]w[redacted]d with confirmed pregnancy failure --CRL=9w 1d --Cytotec administered in MAU per patient request --Blood type A POS --Discharge home in stable condition with bleeding precautions  F/U: --Noralyn Pick, CNM on call for [facility] and in unit. Update provided --Patient to be seen in office in about one week   Darlina Rumpf, CNM 07/19/2020, 5:04 PM

## 2020-07-19 NOTE — MAU Note (Signed)
Presents with c/o VB that began yesterday morning.  Reports VB is bright red with wiping but was initially pink.  Denies LOF.  Endorses light abdominal cramping.

## 2020-07-22 ENCOUNTER — Inpatient Hospital Stay (HOSPITAL_COMMUNITY): Payer: BC Managed Care – PPO

## 2020-07-22 ENCOUNTER — Inpatient Hospital Stay (HOSPITAL_COMMUNITY)
Admission: AD | Admit: 2020-07-22 | Discharge: 2020-07-23 | Disposition: A | Discharge status: Home / Self Care | Payer: BC Managed Care – PPO | Attending: Obstetrics and Gynecology | Admitting: Obstetrics and Gynecology

## 2020-07-22 ENCOUNTER — Other Ambulatory Visit: Payer: Self-pay

## 2020-07-22 ENCOUNTER — Encounter (HOSPITAL_COMMUNITY): Payer: Self-pay | Admitting: Obstetrics and Gynecology

## 2020-07-22 DIAGNOSIS — O021 Missed abortion: Secondary | ICD-10-CM | POA: Diagnosis not present

## 2020-07-22 DIAGNOSIS — R102 Pelvic and perineal pain: Secondary | ICD-10-CM | POA: Diagnosis not present

## 2020-07-22 DIAGNOSIS — D259 Leiomyoma of uterus, unspecified: Secondary | ICD-10-CM | POA: Insufficient documentation

## 2020-07-22 DIAGNOSIS — O039 Complete or unspecified spontaneous abortion without complication: Secondary | ICD-10-CM

## 2020-07-22 DIAGNOSIS — O99011 Anemia complicating pregnancy, first trimester: Secondary | ICD-10-CM | POA: Insufficient documentation

## 2020-07-22 DIAGNOSIS — O26891 Other specified pregnancy related conditions, first trimester: Secondary | ICD-10-CM | POA: Insufficient documentation

## 2020-07-22 DIAGNOSIS — O3411 Maternal care for benign tumor of corpus uteri, first trimester: Secondary | ICD-10-CM | POA: Insufficient documentation

## 2020-07-22 DIAGNOSIS — D500 Iron deficiency anemia secondary to blood loss (chronic): Secondary | ICD-10-CM | POA: Diagnosis not present

## 2020-07-22 DIAGNOSIS — Z3A13 13 weeks gestation of pregnancy: Secondary | ICD-10-CM | POA: Insufficient documentation

## 2020-07-22 DIAGNOSIS — O034 Incomplete spontaneous abortion without complication: Secondary | ICD-10-CM

## 2020-07-22 MED ORDER — KETOROLAC TROMETHAMINE 30 MG/ML IJ SOLN
30.0000 mg | Freq: Once | INTRAMUSCULAR | Status: AC
  Administered 2020-07-22: 30 mg via INTRAMUSCULAR
  Filled 2020-07-22: qty 1

## 2020-07-22 NOTE — MAU Note (Signed)
Pt reports she took cytotec for failed pregnancy on Monday, states she is sure she passed the baby and yesterday the bleeding and cramping was much better. This pm the bleeding suddenly got very heavy and the cramping got really bad, took the pain meds she had at home without relief.

## 2020-07-22 NOTE — MAU Provider Note (Signed)
Chief Complaint:  Vaginal Bleeding and Abdominal Pain   Event Date/Time   First Provider Initiated Contact with Patient 07/22/20 2237     HPI: Kimberly Prince is a 30 y.o. G2P1001 who presents to maternity admissions reporting pain and heavy bleeding after taking Cytotec for a missed abortion. The bleeding and pain improved at one time but then got worse. . She reports vaginal bleeding, but no vaginal itching/burning, urinary symptoms, h/a, dizziness, n/v, or fever/chills.    Vaginal Bleeding The patient's primary symptoms include pelvic pain and vaginal bleeding. The patient's pertinent negatives include no genital itching, genital lesions or genital odor. This is a recurrent problem. The current episode started today. The problem has been unchanged. Associated symptoms include abdominal pain. Pertinent negatives include no chills, constipation, diarrhea, fever, headaches, nausea or vomiting. The vaginal discharge was bloody. The vaginal bleeding is heavier than menses. She has been passing clots. She has been passing tissue. Nothing aggravates the symptoms. She has tried oral narcotics for the symptoms. The treatment provided no relief.  Abdominal Pain This is a recurrent problem. The current episode started today. The problem occurs constantly. The pain is located in the LLQ, RLQ and suprapubic region. The quality of the pain is cramping and sharp. The abdominal pain does not radiate. Pertinent negatives include no constipation, diarrhea, fever, headaches, myalgias, nausea or vomiting. Nothing aggravates the pain. The pain is relieved by Nothing. She has tried oral narcotic analgesics for the symptoms. The treatment provided no relief.   RN Note: Pt reports she took cytotec for failed pregnancy on Monday, states she is sure she passed the baby and yesterday the bleeding and cramping was much better. This pm the bleeding suddenly got very heavy and the cramping got really bad, took the pain meds she  had at home without relief.   Past Medical History: Past Medical History:  Diagnosis Date   Anxiety    Fibroid    Pregnancy induced hypertension    with first pregnancy    Past obstetric history: OB History  Gravida Para Term Preterm AB Living  2 1 1     1   SAB IAB Ectopic Multiple Live Births        0 1    # Outcome Date GA Lbr Len/2nd Weight Sex Delivery Anes PTL Lv  2 Current           1 Term 09/01/19 [redacted]w[redacted]d  3510 g F CS-LTranv Spinal  LIV    Past Surgical History: Past Surgical History:  Procedure Laterality Date   anastamosis of colon     as an infant   CESAREAN SECTION N/A 09/01/2019   Procedure: CESAREAN SECTION-REQUEST RNFA;  Surgeon: Christophe Louis, MD;  Location: [facility];  Service: Obstetrics;  Laterality: N/A;    Family History: Family History  Problem Relation Age of Onset   Healthy Mother    Healthy Father     Social History: Social History   Tobacco Use   Smoking status: Never   Smokeless tobacco: Never  Vaping Use   Vaping Use: Never used  Substance Use Topics   Alcohol use: Not Currently   Drug use: Never    Allergies:  Allergies  Allergen Reactions   Sulfa Antibiotics Hives    Meds:  Medications Prior to Admission  Medication Sig Dispense Refill Last Dose   HYDROcodone-acetaminophen (NORCO/VICODIN) 5-325 MG tablet Take 2 tablets by mouth every 4 (four) hours as needed for up to 10 doses. 20 tablet 0  07/22/2020 at 2000   cetirizine (ZYRTEC) 10 MG tablet Take 10 mg by mouth at bedtime.      ibuprofen (ADVIL) 600 MG tablet Take 1 tablet (600 mg total) by mouth every 6 (six) hours as needed for up to 30 doses. 30 tablet 0    promethazine (PHENERGAN) 12.5 MG tablet Take 1 tablet (12.5 mg total) by mouth every 6 (six) hours as needed for nausea or vomiting. 30 tablet 0     I have reviewed patient's Past Medical Hx, Surgical Hx, Family Hx, Social Hx, medications and allergies.  ROS:  Review of Systems  Constitutional:  Negative for chills and  fever.  Gastrointestinal:  Positive for abdominal pain. Negative for constipation, diarrhea, nausea and vomiting.  Genitourinary:  Positive for pelvic pain and vaginal bleeding.  Musculoskeletal:  Negative for myalgias.  Neurological:  Negative for headaches.  Other systems negative     Physical Exam  Patient Vitals for the past 24 hrs:  BP Temp Temp src Pulse Resp SpO2  07/22/20 2216 112/73 98.7 F (37.1 C) Oral (!) 103 19 100 %  Orthostatic vital signs showed some tachycardia with standing (92 > 117)  Constitutional: Well-developed, well-nourished female in no acute distress.  Cardiovascular: normal rate and rhythm Respiratory: normal effort, no distress GI: Abd soft, non-tender.  Nondistended.  No rebound, No guarding.   MS: Extremities nontender, no edema, normal ROM Neurologic: Alert and oriented x 4.   Grossly nonfocal. GU: Neg CVAT. Skin:  Warm and Dry Psych:  Affect appropriate.  PELVIC EXAM: Copious clots in vault, removed about 70ml.  Steady trickle which subsided later after clots removed.     Labs: Results for orders placed or performed during the hospital encounter of 07/22/20 (from the past 24 hour(s))  CBC     Status: Abnormal   Collection Time: 07/22/20 11:53 PM  Result Value Ref Range   WBC 11.5 (H) 4.0 - 10.5 K/uL   RBC 2.99 (L) 3.87 - 5.11 MIL/uL   Hemoglobin 8.2 (L) 12.0 - 15.0 g/dL   HCT 25.3 (L) 36.0 - 46.0 %   MCV 84.6 80.0 - 100.0 fL   MCH 27.4 26.0 - 34.0 pg   MCHC 32.4 30.0 - 36.0 g/dL   RDW 12.9 11.5 - 15.5 %   Platelets 242 150 - 400 K/uL   nRBC 0.0 0.0 - 0.2 %  Sample to Blood Bank     Status: None   Collection Time: 07/22/20 11:53 PM  Result Value Ref Range   Blood Bank Specimen SAMPLE AVAILABLE FOR TESTING    Sample Expiration      07/23/2020,2359 Performed at [facility], [place] [address], [place], [place] [address]    Pre-Cytotec Hemoglobin  Ref. Range 07/19/2020 10:26  Hemoglobin Latest Ref Range: 12.0 - 15.0 g/dL 13.7    --/--/A POS (08/16 1104)  Imaging:  US PELVIS TRANSVAGINAL NON-OB (TV ONLY)  Result Date: 07/22/2020 CLINICAL DATA:  Missed abortion.  Cytotec Monday.  Heavy bleeding. EXAM: ULTRASOUND PELVIS TRANSVAGINAL TECHNIQUE: Transvaginal ultrasound examination of the pelvis was performed including evaluation of the uterus, ovaries, adnexal regions, and pelvic cul-de-sac. COMPARISON:  07/19/2020 FINDINGS: Uterus Measurements: 9.2 x 5 x 5.5 cm = volume: 132 mL. A posterior myometrial mass is demonstrated, displacing the endometrium. The mass measures about 3.9 cm maximal diameter. There is increased flow within the mass. This likely represents a fibroid. Endometrium Thickness: 5 mm.  No focal abnormality visualized. Right ovary Measurements: 3.6 x 2.7 x  2.9 cm = volume: 15 mL. Normal appearance/no adnexal mass. Left ovary Left ovary is not well visualized. Other findings:  No abnormal free fluid IMPRESSION: Posterior uterine fibroid displacing the endometrium. Endometrial thickness is normal. No endometrial fluid or focal lesion. Electronically Signed   By: Lucienne Capers M.D.   On: 07/22/2020 23:34     MAU Course/MDM: I have ordered labs as follows:  CBC which showed decreased Hgb  Imaging ordered: US showed no retained POC Results reviewed.   Consult Dr Kennon Rounds.  She recommended IVF for replacement, patient declined and wants to go home and PO hydrate.  She states she felt fine when she stood up and would like to go. .   Treatments in MAU included Analgesia, removal of clots. .   Pt stable at time of discharge.  Assessment: Status post missed abortion treated with Cytotec for evacuation Blood loss anemia  Plan: Discharge home Recommend PO hydration Has ibuprofen and Hydrocodone for pain relief Has appt in office in 2 weeks Bleeding precautions, return if increased bleeding  Encouraged to return here or to other Urgent Care/ED if she develops worsening of symptoms, increase in pain, fever, or  other concerning symptoms.   Hansel Feinstein CNM, MSN Certified Nurse-Midwife 07/22/2020 10:37 PM

## 2020-07-23 DIAGNOSIS — O021 Missed abortion: Secondary | ICD-10-CM

## 2020-07-23 DIAGNOSIS — D500 Iron deficiency anemia secondary to blood loss (chronic): Secondary | ICD-10-CM

## 2020-07-23 LAB — CBC
HCT: 25.3 % — ABNORMAL LOW (ref 36.0–46.0)
Hemoglobin: 8.2 g/dL — ABNORMAL LOW (ref 12.0–15.0)
MCH: 27.4 pg (ref 26.0–34.0)
MCHC: 32.4 g/dL (ref 30.0–36.0)
MCV: 84.6 fL (ref 80.0–100.0)
Platelets: 242 10*3/uL (ref 150–400)
RBC: 2.99 MIL/uL — ABNORMAL LOW (ref 3.87–5.11)
RDW: 12.9 % (ref 11.5–15.5)
WBC: 11.5 10*3/uL — ABNORMAL HIGH (ref 4.0–10.5)
nRBC: 0 % (ref 0.0–0.2)

## 2020-07-23 LAB — SAMPLE TO BLOOD BANK

## 2020-07-23 MED ORDER — SODIUM CHLORIDE 0.9 % IV SOLN: Freq: Once | INTRAVENOUS | Status: DC

## 2020-07-23 MED ORDER — FERROUS SULFATE 325 (65 FE) MG PO TABS: 325.0000 mg | ORAL_TABLET | Freq: Two times a day (BID) | ORAL | 1 refills | Status: DC

## 2020-08-14 ENCOUNTER — Other Ambulatory Visit: Payer: Self-pay | Admitting: Advanced Practice Midwife

## 2021-03-21 LAB — OB RESULTS CONSOLE GC/CHLAMYDIA
Chlamydia: NEGATIVE
Neisseria Gonorrhea: NEGATIVE

## 2021-03-21 LAB — OB RESULTS CONSOLE RUBELLA ANTIBODY, IGM: Rubella: IMMUNE

## 2021-03-21 LAB — OB RESULTS CONSOLE ABO/RH: RH Type: POSITIVE

## 2021-03-21 LAB — HEPATITIS C ANTIBODY: HCV Ab: NEGATIVE

## 2021-03-21 LAB — OB RESULTS CONSOLE HIV ANTIBODY (ROUTINE TESTING): HIV: NONREACTIVE

## 2021-03-21 LAB — OB RESULTS CONSOLE HEPATITIS B SURFACE ANTIGEN: Hepatitis B Surface Ag: NEGATIVE

## 2021-03-21 LAB — OB RESULTS CONSOLE ANTIBODY SCREEN: Antibody Screen: NEGATIVE

## 2021-03-21 LAB — OB RESULTS CONSOLE RPR: RPR: NONREACTIVE

## 2021-05-10 ENCOUNTER — Telehealth: Payer: Self-pay

## 2021-05-10 ENCOUNTER — Other Ambulatory Visit: Payer: Self-pay | Admitting: Obstetrics and Gynecology

## 2021-05-10 DIAGNOSIS — Z3689 Encounter for other specified antenatal screening: Secondary | ICD-10-CM

## 2021-05-30 ENCOUNTER — Encounter: Payer: Self-pay | Admitting: *Deleted

## 2021-05-30 NOTE — Progress Notes (Signed)
232lb  ?

## 2021-06-01 ENCOUNTER — Other Ambulatory Visit: Payer: Self-pay | Admitting: *Deleted

## 2021-06-01 ENCOUNTER — Ambulatory Visit: Payer: BC Managed Care – PPO | Admitting: *Deleted

## 2021-06-01 ENCOUNTER — Ambulatory Visit: Payer: BC Managed Care – PPO | Attending: Obstetrics and Gynecology

## 2021-06-01 VITALS — BP 113/69 | HR 96

## 2021-06-01 DIAGNOSIS — Z3689 Encounter for other specified antenatal screening: Secondary | ICD-10-CM | POA: Diagnosis not present

## 2021-06-01 DIAGNOSIS — Z3A19 19 weeks gestation of pregnancy: Secondary | ICD-10-CM

## 2021-06-01 DIAGNOSIS — O99212 Obesity complicating pregnancy, second trimester: Secondary | ICD-10-CM

## 2021-06-01 DIAGNOSIS — O09292 Supervision of pregnancy with other poor reproductive or obstetric history, second trimester: Secondary | ICD-10-CM

## 2021-06-01 DIAGNOSIS — E669 Obesity, unspecified: Secondary | ICD-10-CM | POA: Diagnosis not present

## 2021-06-01 DIAGNOSIS — O34219 Maternal care for unspecified type scar from previous cesarean delivery: Secondary | ICD-10-CM

## 2021-06-01 DIAGNOSIS — Z362 Encounter for other antenatal screening follow-up: Secondary | ICD-10-CM

## 2021-07-04 ENCOUNTER — Encounter: Payer: Self-pay | Admitting: *Deleted

## 2021-07-04 ENCOUNTER — Ambulatory Visit: Payer: BC Managed Care – PPO | Attending: Obstetrics and Gynecology

## 2021-07-04 ENCOUNTER — Other Ambulatory Visit: Payer: Self-pay | Admitting: *Deleted

## 2021-07-04 ENCOUNTER — Ambulatory Visit: Payer: BC Managed Care – PPO | Admitting: *Deleted

## 2021-07-04 VITALS — BP 135/65 | HR 92

## 2021-07-04 DIAGNOSIS — O09292 Supervision of pregnancy with other poor reproductive or obstetric history, second trimester: Secondary | ICD-10-CM | POA: Diagnosis not present

## 2021-07-04 DIAGNOSIS — O99212 Obesity complicating pregnancy, second trimester: Secondary | ICD-10-CM | POA: Insufficient documentation

## 2021-07-04 DIAGNOSIS — E669 Obesity, unspecified: Secondary | ICD-10-CM

## 2021-07-04 DIAGNOSIS — Z362 Encounter for other antenatal screening follow-up: Secondary | ICD-10-CM

## 2021-07-04 DIAGNOSIS — Z6838 Body mass index (BMI) 38.0-38.9, adult: Secondary | ICD-10-CM | POA: Insufficient documentation

## 2021-07-04 DIAGNOSIS — O34219 Maternal care for unspecified type scar from previous cesarean delivery: Secondary | ICD-10-CM

## 2021-07-04 DIAGNOSIS — Z3A24 24 weeks gestation of pregnancy: Secondary | ICD-10-CM

## 2021-07-04 DIAGNOSIS — O09299 Supervision of pregnancy with other poor reproductive or obstetric history, unspecified trimester: Secondary | ICD-10-CM

## 2021-08-01 ENCOUNTER — Ambulatory Visit: Payer: BC Managed Care – PPO | Admitting: *Deleted

## 2021-08-01 ENCOUNTER — Ambulatory Visit: Payer: BC Managed Care – PPO | Attending: Obstetrics

## 2021-08-01 ENCOUNTER — Encounter: Payer: Self-pay | Admitting: *Deleted

## 2021-08-01 VITALS — BP 125/74 | HR 97

## 2021-08-01 DIAGNOSIS — O99212 Obesity complicating pregnancy, second trimester: Secondary | ICD-10-CM | POA: Insufficient documentation

## 2021-08-01 DIAGNOSIS — Z362 Encounter for other antenatal screening follow-up: Secondary | ICD-10-CM | POA: Diagnosis present

## 2021-08-01 DIAGNOSIS — O09293 Supervision of pregnancy with other poor reproductive or obstetric history, third trimester: Secondary | ICD-10-CM | POA: Diagnosis not present

## 2021-08-01 DIAGNOSIS — Z3A28 28 weeks gestation of pregnancy: Secondary | ICD-10-CM

## 2021-08-01 DIAGNOSIS — O09299 Supervision of pregnancy with other poor reproductive or obstetric history, unspecified trimester: Secondary | ICD-10-CM | POA: Diagnosis present

## 2021-08-01 DIAGNOSIS — O99213 Obesity complicating pregnancy, third trimester: Secondary | ICD-10-CM

## 2021-08-01 DIAGNOSIS — E669 Obesity, unspecified: Secondary | ICD-10-CM

## 2021-08-01 DIAGNOSIS — O34219 Maternal care for unspecified type scar from previous cesarean delivery: Secondary | ICD-10-CM | POA: Insufficient documentation

## 2021-08-02 ENCOUNTER — Other Ambulatory Visit: Payer: Self-pay | Admitting: *Deleted

## 2021-08-02 DIAGNOSIS — Z6838 Body mass index (BMI) 38.0-38.9, adult: Secondary | ICD-10-CM

## 2021-08-28 ENCOUNTER — Other Ambulatory Visit: Payer: Self-pay

## 2021-08-28 ENCOUNTER — Inpatient Hospital Stay (HOSPITAL_COMMUNITY)
Admission: AD | Admit: 2021-08-28 | Discharge: 2021-08-28 | Disposition: A | Discharge status: Home / Self Care | Payer: BC Managed Care – PPO | Attending: Obstetrics and Gynecology | Admitting: Obstetrics and Gynecology

## 2021-08-28 ENCOUNTER — Encounter (HOSPITAL_COMMUNITY): Payer: Self-pay | Admitting: Obstetrics and Gynecology

## 2021-08-28 DIAGNOSIS — O368130 Decreased fetal movements, third trimester, not applicable or unspecified: Secondary | ICD-10-CM | POA: Diagnosis present

## 2021-08-28 DIAGNOSIS — O26893 Other specified pregnancy related conditions, third trimester: Secondary | ICD-10-CM | POA: Diagnosis not present

## 2021-08-28 DIAGNOSIS — O99343 Other mental disorders complicating pregnancy, third trimester: Secondary | ICD-10-CM | POA: Diagnosis not present

## 2021-08-28 DIAGNOSIS — O212 Late vomiting of pregnancy: Secondary | ICD-10-CM | POA: Diagnosis not present

## 2021-08-28 DIAGNOSIS — O99891 Other specified diseases and conditions complicating pregnancy: Secondary | ICD-10-CM | POA: Diagnosis not present

## 2021-08-28 DIAGNOSIS — F419 Anxiety disorder, unspecified: Secondary | ICD-10-CM | POA: Diagnosis not present

## 2021-08-28 DIAGNOSIS — Z3493 Encounter for supervision of normal pregnancy, unspecified, third trimester: Secondary | ICD-10-CM | POA: Diagnosis not present

## 2021-08-28 DIAGNOSIS — Z3A32 32 weeks gestation of pregnancy: Secondary | ICD-10-CM | POA: Diagnosis not present

## 2021-08-28 DIAGNOSIS — R030 Elevated blood-pressure reading, without diagnosis of hypertension: Secondary | ICD-10-CM | POA: Insufficient documentation

## 2021-08-28 LAB — URINALYSIS, ROUTINE W REFLEX MICROSCOPIC
Bilirubin Urine: NEGATIVE
Glucose, UA: NEGATIVE mg/dL
Hgb urine dipstick: NEGATIVE
Ketones, ur: NEGATIVE mg/dL
Leukocytes,Ua: NEGATIVE
Nitrite: NEGATIVE
Protein, ur: NEGATIVE mg/dL
Specific Gravity, Urine: 1.025 (ref 1.005–1.030)
pH: 6.5 (ref 5.0–8.0)

## 2021-08-28 LAB — COMPREHENSIVE METABOLIC PANEL
ALT: 17 U/L (ref 0–44)
AST: 17 U/L (ref 15–41)
Albumin: 2.6 g/dL — ABNORMAL LOW (ref 3.5–5.0)
Alkaline Phosphatase: 90 U/L (ref 38–126)
Anion gap: 7 (ref 5–15)
BUN: 6 mg/dL (ref 6–20)
CO2: 22 mmol/L (ref 22–32)
Calcium: 9 mg/dL (ref 8.9–10.3)
Chloride: 105 mmol/L (ref 98–111)
Creatinine, Ser: 0.46 mg/dL (ref 0.44–1.00)
GFR, Estimated: >60 mL/min (ref >60)
Glucose, Bld: 106 mg/dL — ABNORMAL HIGH (ref 70–99)
Potassium: 4 mmol/L (ref 3.5–5.1)
Sodium: 134 mmol/L — ABNORMAL LOW (ref 135–145)
Total Bilirubin: 0.3 mg/dL (ref 0.3–1.2)
Total Protein: 6 g/dL — ABNORMAL LOW (ref 6.5–8.1)

## 2021-08-28 LAB — PROTEIN / CREATININE RATIO, URINE
Creatinine, Urine: 173 mg/dL
Protein Creatinine Ratio: 0.06 mg/mg{Cre} (ref 0.00–0.15)
Total Protein, Urine: 10 mg/dL

## 2021-08-28 LAB — CBC WITH DIFFERENTIAL/PLATELET
Abs Immature Granulocytes: 0.05 10*3/uL (ref 0.00–0.07)
Basophils Absolute: 0 10*3/uL (ref 0.0–0.1)
Basophils Relative: 0 %
Eosinophils Absolute: 0.1 10*3/uL (ref 0.0–0.5)
Eosinophils Relative: 1 %
HCT: 27.9 % — ABNORMAL LOW (ref 36.0–46.0)
Hemoglobin: 9 g/dL — ABNORMAL LOW (ref 12.0–15.0)
Immature Granulocytes: 1 %
Lymphocytes Relative: 18 %
Lymphs Abs: 1.9 10*3/uL (ref 0.7–4.0)
MCH: 26.2 pg (ref 26.0–34.0)
MCHC: 32.3 g/dL (ref 30.0–36.0)
MCV: 81.3 fL (ref 80.0–100.0)
Monocytes Absolute: 0.6 10*3/uL (ref 0.1–1.0)
Monocytes Relative: 6 %
Neutro Abs: 8.2 10*3/uL — ABNORMAL HIGH (ref 1.7–7.7)
Neutrophils Relative %: 74 %
Platelets: 229 10*3/uL (ref 150–400)
RBC: 3.43 MIL/uL — ABNORMAL LOW (ref 3.87–5.11)
RDW: 13.6 % (ref 11.5–15.5)
WBC: 10.9 10*3/uL — ABNORMAL HIGH (ref 4.0–10.5)
nRBC: 0 % (ref 0.0–0.2)

## 2021-08-28 NOTE — MAU Note (Signed)
Kimberly Prince is a 31 y.o. at 25w2dhere in MAU reporting: she's had decreased FM today, reports has only felt 2 movements today.  Reports has eaten today.  Also reports has a hx pf Pre E with previous pregnancy so has been "trying to keep up with" BP's and generally doesn't feel well.  Took BP @ home BP 161/98 with arm cuff & 148/96 with wrist cuff.  Denies current visual disturbances and epigastric pain, endorses slight H/A. Denies VB or LOF. LMP: N/A Onset of complaint: today Pain score: 0 Vitals:   08/28/21 1336  BP: (!) 143/72  Pulse: (!) 108  Resp: 19  Temp: 98.4 F (36.9 C)  SpO2: 98%     FBSW:HQPRFFMBd/t maternal apparel Lab orders placed from triage:   UA

## 2021-08-28 NOTE — MAU Provider Note (Signed)
History     CSN: [id number]  Arrival date and time: 08/28/21 1318   Event Date/Time   First Provider Initiated Contact with Patient 08/28/21 1416      Chief Complaint  Patient presents with   Decreased Fetal Movement   BP Evaluation    Kimberly Prince, a  31 y.o. G3P1011 at 59w2dpresents to MAU with complaints of High blood pressure at home and decreased fetal movement. Patient states she checked her blood pressure at home on her right arm and her BP was 148/98. States she waited 10 mins or so then re-checked with a wrist cuff on the right side and BP was 161/96 so she presented to MAU. Denies headache, blurred vision, and epigastric pain. She also denies sudden overt swelling. Patient states she has nausea, vomiting and diarrhea on Friday and early morning Saturday, but none since. She denies fever chills and/or contact with someone who could be sick.   She also has complaints for decreased fetal movement since this morning.  States she "hadnt really paid much attention" to it this morning, but "attempted to lay down for 30 minutes" and felt no improvement.  Patient states she had only felt her move 2 times today, but since arrival to MAU patient states she has felt multiple fetal movements and is re-assured.         OB History     Gravida  3   Para  1   Term  1   Preterm      AB  1   Living  1      SAB  1   IAB      Ectopic      Multiple  0   Live Births  1           Past Medical History:  Diagnosis Date   Anxiety    Fibroid    Pregnancy induced hypertension    with first pregnancy    Past Surgical History:  Procedure Laterality Date   anastamosis of colon     as an infant   CESAREAN SECTION N/A 09/01/2019   Procedure: CESAREAN SECTION-REQUEST RNFA;  Surgeon: CChristophe Louis MD;  [facility];  Service: Obstetrics;  Laterality: N/A;    Family History  Problem Relation Age of Onset   Hypertension Mother    Healthy Mother    Healthy Father     COPD Maternal Grandmother    Cancer Maternal Grandfather    Cancer Paternal Grandfather     Social History   Tobacco Use   Smoking status: Never   Smokeless tobacco: Never  Vaping Use   Vaping Use: Never used  Substance Use Topics   Alcohol use: Not Currently   Drug use: Never    Allergies:  Allergies  Allergen Reactions   Sulfa Antibiotics Hives    Medications Prior to Admission  Medication Sig Dispense Refill Last Dose   aspirin EC 81 MG tablet Take 81 mg by mouth daily. Swallow whole.   08/27/2021   sertraline (ZOLOFT) 50 MG tablet Take 100 mg by mouth daily.   08/27/2021   albuterol (VENTOLIN HFA) 108 (90 Base) MCG/ACT inhaler Inhale into the lungs. (Patient not taking: Reported on 06/01/2021)      cetirizine (ZYRTEC) 10 MG tablet Take 10 mg by mouth at bedtime. (Patient not taking: Reported on 06/01/2021)      lisdexamfetamine (VYVANSE) 40 MG capsule Take by mouth. (Patient not taking: Reported on 06/01/2021)  Prenatal Vit-Fe Fumarate-FA (PRENATAL MULTIVITAMIN) TABS tablet Take 1 tablet by mouth daily at 12 noon.      VYVANSE 40 MG capsule  (Patient not taking: Reported on 06/01/2021)       Review of Systems  Constitutional:  Negative for chills, fatigue, fever and unexpected weight change.  Respiratory:  Negative for apnea, shortness of breath and wheezing.   Cardiovascular:  Negative for chest pain and leg swelling.  Gastrointestinal:  Negative for abdominal pain, constipation, diarrhea, nausea and vomiting.  Genitourinary:  Negative for difficulty urinating, dysuria, pelvic pain, vaginal bleeding, vaginal discharge and vaginal pain.  Musculoskeletal:  Negative for back pain.  Neurological:  Positive for light-headedness. Negative for dizziness, weakness, numbness and headaches.   Physical Exam   Blood pressure (!) 143/72, pulse (!) 108, temperature 98.4 F (36.9 C), temperature source Oral, resp. rate 19, height '5\' 6"'$  (1.676 m), weight (!) 147.6 kg, last  menstrual period 01/14/2021, SpO2 98 %, unknown if currently breastfeeding.  Physical Exam Vitals (Fetal movment present on palpation) and nursing note reviewed.  Constitutional:      General: She is not in acute distress.    Appearance: She is not ill-appearing.  HENT:     Head: Normocephalic.     Nose: Nose normal.  Eyes:     Conjunctiva/sclera: Conjunctivae normal.  Cardiovascular:     Rate and Rhythm: Normal rate and regular rhythm.     Pulses: Normal pulses.  Pulmonary:     Effort: Pulmonary effort is normal.  Abdominal:     Palpations: Abdomen is soft.     Tenderness: There is no abdominal tenderness. There is no right CVA tenderness or left CVA tenderness.     Comments: Pregnant  Musculoskeletal:        General: No swelling. Normal range of motion.     Cervical back: Normal range of motion.     Right lower leg: No edema.     Left lower leg: No edema.  Skin:    General: Skin is warm and dry.  Neurological:     Mental Status: She is alert and oriented to person, place, and time.  Psychiatric:        Behavior: Behavior normal.    FHT: 140 bpm moderate variability  Accels presents and  Decels absent Toco Quiet  Cat I   Patient has hit the clicker for fetal movement >10 times since arrival while CNM at bedside.  MAU Course  Procedures Orders Placed This Encounter  Procedures   Urinalysis, Routine w reflex microscopic Urine, Clean Catch    Standing Status:   Standing    Number of Occurrences:   1   CBC with Differential/Platelet    Standing Status:   Standing    Number of Occurrences:   1   Comprehensive metabolic panel    Standing Status:   Standing    Number of Occurrences:   1   Protein / creatinine ratio, urine    Standing Status:   Standing    Number of Occurrences:   1   Results for orders placed or performed during the hospital encounter of 08/28/21 (from the past 24 hour(s))  Urinalysis, Routine w reflex microscopic Urine, Clean Catch     Status:  Abnormal   Collection Time: 08/28/21  1:41 PM  Result Value Ref Range   Color, Urine YELLOW YELLOW   APPearance HAZY (A) CLEAR   Specific Gravity, Urine 1.025 1.005 - 1.030   pH 6.5 5.0 -  8.0   Glucose, UA NEGATIVE NEGATIVE mg/dL   Hgb urine dipstick NEGATIVE NEGATIVE   Bilirubin Urine NEGATIVE NEGATIVE   Ketones, ur NEGATIVE NEGATIVE mg/dL   Protein, ur NEGATIVE NEGATIVE mg/dL   Nitrite NEGATIVE NEGATIVE   Leukocytes,Ua NEGATIVE NEGATIVE  Protein / creatinine ratio, urine     Status: None   Collection Time: 08/28/21  1:41 PM  Result Value Ref Range   Creatinine, Urine 173 mg/dL   Total Protein, Urine 10 mg/dL   Protein Creatinine Ratio 0.06 0.00 - 0.15 mg/mg[Cre]  CBC with Differential/Platelet     Status: Abnormal   Collection Time: 08/28/21  2:31 PM  Result Value Ref Range   WBC 10.9 (H) 4.0 - 10.5 K/uL   RBC 3.43 (L) 3.87 - 5.11 MIL/uL   Hemoglobin 9.0 (L) 12.0 - 15.0 g/dL   HCT 27.9 (L) 36.0 - 46.0 %   MCV 81.3 80.0 - 100.0 fL   MCH 26.2 26.0 - 34.0 pg   MCHC 32.3 30.0 - 36.0 g/dL   RDW 13.6 11.5 - 15.5 %   Platelets 229 150 - 400 K/uL   nRBC 0.0 0.0 - 0.2 %   Neutrophils Relative % 74 %   Neutro Abs 8.2 (H) 1.7 - 7.7 K/uL   Lymphocytes Relative 18 %   Lymphs Abs 1.9 0.7 - 4.0 K/uL   Monocytes Relative 6 %   Monocytes Absolute 0.6 0.1 - 1.0 K/uL   Eosinophils Relative 1 %   Eosinophils Absolute 0.1 0.0 - 0.5 K/uL   Basophils Relative 0 %   Basophils Absolute 0.0 0.0 - 0.1 K/uL   Immature Granulocytes 1 %   Abs Immature Granulocytes 0.05 0.00 - 0.07 K/uL  Comprehensive metabolic panel     Status: Abnormal   Collection Time: 08/28/21  2:31 PM  Result Value Ref Range   Sodium 134 (L) 135 - 145 mmol/L   Potassium 4.0 3.5 - 5.1 mmol/L   Chloride 105 98 - 111 mmol/L   CO2 22 22 - 32 mmol/L   Glucose, Bld 106 (H) 70 - 99 mg/dL   BUN 6 6 - 20 mg/dL   Creatinine, Ser 0.46 0.44 - 1.00 mg/dL   Calcium 9.0 8.9 - 10.3 mg/dL   Total Protein 6.0 (L) 6.5 - 8.1 g/dL    Albumin 2.6 (L) 3.5 - 5.0 g/dL   AST 17 15 - 41 U/L   ALT 17 0 - 44 U/L   Alkaline Phosphatase 90 38 - 126 U/L   Total Bilirubin 0.3 0.3 - 1.2 mg/dL   GFR, Estimated >60 >60 mL/min   Anion gap 7 5 - 15   Patient Vitals for the past 24 hrs:  BP Temp Temp src Pulse Resp SpO2 Height Weight  08/28/21 1532 (!) 85/57 -- -- (!) 101 -- -- -- --  08/28/21 1530 -- -- -- -- -- 99 % -- --  08/28/21 1525 -- -- -- -- -- 99 % -- --  08/28/21 1520 -- -- -- -- -- 99 % -- --  08/28/21 1516 131/67 -- -- 99 -- -- -- --  08/28/21 1515 -- -- -- -- -- 98 % -- --  08/28/21 1510 -- -- -- -- -- 99 % -- --  08/28/21 1505 -- -- -- -- -- 98 % -- --  08/28/21 1501 138/70 -- -- 98 -- -- -- --  08/28/21 1500 -- -- -- -- -- 99 % -- --  08/28/21 1455 -- -- -- -- -- 98 % -- --  08/28/21 1450 -- -- -- -- -- 98 % -- --  08/28/21 1446 122/69 -- -- (!) 102 -- -- -- --  08/28/21 1445 -- -- -- -- -- 97 % -- --  08/28/21 1440 -- -- -- -- -- 98 % -- --  08/28/21 1435 -- -- -- -- -- 98 % -- --  08/28/21 1431 126/61 -- -- 95 -- -- -- --  08/28/21 1430 -- -- -- -- -- 98 % -- --  08/28/21 1425 -- -- -- -- -- 99 % -- --  08/28/21 1420 -- -- -- -- -- 99 % -- --  08/28/21 1416 (!) 120/55 -- -- 100 -- -- -- --  08/28/21 1415 -- -- -- -- -- 99 % -- --  08/28/21 1410 -- -- -- -- -- 98 % -- --  08/28/21 1406 124/70 -- -- (!) 106 -- -- -- --  08/28/21 1405 -- -- -- -- -- 99 % -- --  08/28/21 1336 (!) 143/72 98.4 F (36.9 C) Oral (!) 108 19 98 % -- --  08/28/21 1328 -- -- -- -- -- -- '5\' 6"'$  (1.676 m) (!) 147.6 kg     MDM - Active Fetal Movement- Mother reassured  - FHT Cat I  - Liver enzymes normal  - BP's normotensive to mildly elevated during MAU.   - PreE labs normal, in the absence of Severe PreE features. Low suspicion for PreE  - Plan for discharge    Assessment and Plan   1. Elevated BP without diagnosis of hypertension   2. [redacted] weeks gestation of pregnancy   3. Movement of fetus present during pregnancy in third  trimester    - Discussed that this is not Preeclampsia based on presentation lab work. Encouraged to discuss this visit with OB and monitor BPs at home if patient becomes symptomatic given previous history.  - PreE signs and symptoms reviewed. Return precautions discussed. - Preterm Labor precautions reviewed.  -FHT: Cat I upon discharge    - Patient stable upon discharge from MAU. Patient may return to MAU an needed.   Jacquiline Doe, MSN CNM  08/28/2021, 2:16 PM

## 2021-08-29 ENCOUNTER — Ambulatory Visit: Payer: BC Managed Care – PPO | Admitting: *Deleted

## 2021-08-29 ENCOUNTER — Other Ambulatory Visit: Payer: Self-pay | Admitting: *Deleted

## 2021-08-29 ENCOUNTER — Ambulatory Visit: Payer: BC Managed Care – PPO | Attending: Obstetrics

## 2021-08-29 VITALS — BP 128/73 | HR 103

## 2021-08-29 DIAGNOSIS — Z6838 Body mass index (BMI) 38.0-38.9, adult: Secondary | ICD-10-CM | POA: Insufficient documentation

## 2021-08-29 DIAGNOSIS — E669 Obesity, unspecified: Secondary | ICD-10-CM | POA: Insufficient documentation

## 2021-08-29 DIAGNOSIS — Z3689 Encounter for other specified antenatal screening: Secondary | ICD-10-CM | POA: Insufficient documentation

## 2021-08-29 DIAGNOSIS — O09293 Supervision of pregnancy with other poor reproductive or obstetric history, third trimester: Secondary | ICD-10-CM | POA: Insufficient documentation

## 2021-08-29 DIAGNOSIS — O34219 Maternal care for unspecified type scar from previous cesarean delivery: Secondary | ICD-10-CM | POA: Diagnosis not present

## 2021-08-29 DIAGNOSIS — O99213 Obesity complicating pregnancy, third trimester: Secondary | ICD-10-CM | POA: Diagnosis not present

## 2021-08-29 DIAGNOSIS — Z3A32 32 weeks gestation of pregnancy: Secondary | ICD-10-CM | POA: Insufficient documentation

## 2021-08-29 DIAGNOSIS — O09299 Supervision of pregnancy with other poor reproductive or obstetric history, unspecified trimester: Secondary | ICD-10-CM

## 2021-09-12 ENCOUNTER — Encounter: Payer: Self-pay | Admitting: *Deleted

## 2021-09-12 ENCOUNTER — Ambulatory Visit: Payer: BC Managed Care – PPO | Admitting: *Deleted

## 2021-09-12 ENCOUNTER — Ambulatory Visit: Payer: BC Managed Care – PPO | Attending: Obstetrics

## 2021-09-12 VITALS — BP 119/78 | HR 91

## 2021-09-12 DIAGNOSIS — Z6838 Body mass index (BMI) 38.0-38.9, adult: Secondary | ICD-10-CM | POA: Insufficient documentation

## 2021-09-12 DIAGNOSIS — Z3A34 34 weeks gestation of pregnancy: Secondary | ICD-10-CM

## 2021-09-12 DIAGNOSIS — Z6837 Body mass index (BMI) 37.0-37.9, adult: Secondary | ICD-10-CM | POA: Insufficient documentation

## 2021-09-12 DIAGNOSIS — O34219 Maternal care for unspecified type scar from previous cesarean delivery: Secondary | ICD-10-CM

## 2021-09-12 DIAGNOSIS — O99213 Obesity complicating pregnancy, third trimester: Secondary | ICD-10-CM | POA: Diagnosis not present

## 2021-09-12 DIAGNOSIS — E669 Obesity, unspecified: Secondary | ICD-10-CM

## 2021-09-12 DIAGNOSIS — O09293 Supervision of pregnancy with other poor reproductive or obstetric history, third trimester: Secondary | ICD-10-CM

## 2021-09-12 DIAGNOSIS — O133 Gestational [pregnancy-induced] hypertension without significant proteinuria, third trimester: Secondary | ICD-10-CM

## 2021-09-16 ENCOUNTER — Telehealth (HOSPITAL_COMMUNITY): Payer: Self-pay | Admitting: *Deleted

## 2021-09-16 ENCOUNTER — Encounter (HOSPITAL_COMMUNITY): Payer: Self-pay

## 2021-09-16 NOTE — Patient Instructions (Addendum)
Kimberly Prince  09/16/2021   Your procedure is scheduled on:  09/30/2021  Arrive at 96 at Entrance C on [address] [facility]  and [employer]. You are invited to use the FREE valet parking or use the Visitor's parking deck.  Pick up the phone at the desk and dial [phone].  Call this number if you have problems the morning of surgery: [phone]  Remember:   Do not eat food:(After Midnight) Desps de medianoche.  Do not drink clear liquids: (After Midnight) Desps de medianoche.  Take these medicines the morning of surgery with A SIP OF WATER:  take your zoloft as prescribed   Do not wear jewelry, make-up or nail polish.  Do not wear lotions, powders, or perfumes. Do not wear deodorant.  Do not shave 48 hours prior to surgery.  Do not bring valuables to the hospital.  [facility] is not   responsible for any belongings or valuables brought to the hospital.  Contacts, dentures or bridgework may not be worn into surgery.  Leave suitcase in the car. After surgery it may be brought to your room.  For patients admitted to the hospital, checkout time is 11:00 AM the day of              discharge.      Please read over the following fact sheets that you were given:     Preparing for Surgery

## 2021-09-16 NOTE — Telephone Encounter (Signed)
Preadmission screen  

## 2021-09-20 ENCOUNTER — Telehealth (HOSPITAL_COMMUNITY): Payer: Self-pay | Admitting: *Deleted

## 2021-09-20 NOTE — Telephone Encounter (Signed)
Preadmission screen  

## 2021-09-21 ENCOUNTER — Encounter: Payer: Self-pay | Admitting: *Deleted

## 2021-09-21 ENCOUNTER — Ambulatory Visit: Payer: BC Managed Care – PPO | Admitting: *Deleted

## 2021-09-21 ENCOUNTER — Telehealth (HOSPITAL_COMMUNITY): Payer: Self-pay | Admitting: *Deleted

## 2021-09-21 ENCOUNTER — Ambulatory Visit: Payer: BC Managed Care – PPO | Attending: Obstetrics

## 2021-09-21 VITALS — BP 138/94 | HR 97

## 2021-09-21 DIAGNOSIS — Z6838 Body mass index (BMI) 38.0-38.9, adult: Secondary | ICD-10-CM | POA: Diagnosis not present

## 2021-09-21 DIAGNOSIS — O133 Gestational [pregnancy-induced] hypertension without significant proteinuria, third trimester: Secondary | ICD-10-CM

## 2021-09-21 DIAGNOSIS — O34219 Maternal care for unspecified type scar from previous cesarean delivery: Secondary | ICD-10-CM | POA: Diagnosis present

## 2021-09-21 DIAGNOSIS — Z3A35 35 weeks gestation of pregnancy: Secondary | ICD-10-CM | POA: Diagnosis not present

## 2021-09-21 DIAGNOSIS — O09293 Supervision of pregnancy with other poor reproductive or obstetric history, third trimester: Secondary | ICD-10-CM | POA: Insufficient documentation

## 2021-09-21 DIAGNOSIS — O09299 Supervision of pregnancy with other poor reproductive or obstetric history, unspecified trimester: Secondary | ICD-10-CM | POA: Diagnosis present

## 2021-09-21 DIAGNOSIS — O99213 Obesity complicating pregnancy, third trimester: Secondary | ICD-10-CM | POA: Diagnosis not present

## 2021-09-21 DIAGNOSIS — E669 Obesity, unspecified: Secondary | ICD-10-CM

## 2021-09-21 NOTE — Telephone Encounter (Signed)
Preadmission screen  

## 2021-09-23 ENCOUNTER — Other Ambulatory Visit: Payer: BC Managed Care – PPO

## 2021-09-23 ENCOUNTER — Ambulatory Visit: Payer: BC Managed Care – PPO

## 2021-09-26 ENCOUNTER — Inpatient Hospital Stay (HOSPITAL_COMMUNITY)
Admission: AD | Admit: 2021-09-26 | Discharge: 2021-09-26 | Disposition: A | Discharge status: Home / Self Care | Payer: BC Managed Care – PPO | Attending: Obstetrics and Gynecology | Admitting: Obstetrics and Gynecology

## 2021-09-26 ENCOUNTER — Inpatient Hospital Stay (HOSPITAL_BASED_OUTPATIENT_CLINIC_OR_DEPARTMENT_OTHER): Payer: BC Managed Care – PPO

## 2021-09-26 ENCOUNTER — Encounter (HOSPITAL_COMMUNITY): Payer: Self-pay | Admitting: Obstetrics and Gynecology

## 2021-09-26 ENCOUNTER — Encounter (HOSPITAL_COMMUNITY): Payer: Self-pay

## 2021-09-26 DIAGNOSIS — Z3493 Encounter for supervision of normal pregnancy, unspecified, third trimester: Secondary | ICD-10-CM

## 2021-09-26 DIAGNOSIS — Z3A36 36 weeks gestation of pregnancy: Secondary | ICD-10-CM

## 2021-09-26 DIAGNOSIS — O368190 Decreased fetal movements, unspecified trimester, not applicable or unspecified: Secondary | ICD-10-CM | POA: Diagnosis not present

## 2021-09-26 DIAGNOSIS — O479 False labor, unspecified: Secondary | ICD-10-CM | POA: Diagnosis not present

## 2021-09-26 DIAGNOSIS — O4703 False labor before 37 completed weeks of gestation, third trimester: Secondary | ICD-10-CM | POA: Diagnosis not present

## 2021-09-26 DIAGNOSIS — O368130 Decreased fetal movements, third trimester, not applicable or unspecified: Secondary | ICD-10-CM | POA: Insufficient documentation

## 2021-09-26 LAB — POCT FERN TEST: POCT Fern Test: NEGATIVE

## 2021-09-26 MED ORDER — CYCLOBENZAPRINE HCL 5 MG PO TABS
10.0000 mg | ORAL_TABLET | Freq: Once | ORAL | Status: AC
  Administered 2021-09-26: 10 mg via ORAL
  Filled 2021-09-26 (×2): qty 2

## 2021-09-26 MED ORDER — ACETAMINOPHEN 500 MG PO TABS
1000.0000 mg | ORAL_TABLET | Freq: Once | ORAL | Status: AC
Start: 2021-09-26 — End: 2021-09-26
  Administered 2021-09-26: 1000 mg via ORAL
  Filled 2021-09-26 (×2): qty 2

## 2021-09-26 NOTE — MAU Provider Note (Signed)
S: [name] is a 31 y.o. G3P1011 at [redacted]w[redacted]d who presents to MAU today complaining of leaking of fluid since 3 days ago. Patient states she thought it was just vaginal discharge. She denies having a gush of fluid, but endorses wearing a pad throughout the day. Denies a color or odor. She denies vaginal bleeding. She endorses "period like cramping, "but more intense" .  She reports decreased fetal movement since arrival to MAU, but reports she had felt movement throughout the day today.    O: BP (!) 124/90   Pulse (!) 114   Temp 98.1 F (36.7 C) (Oral)   Resp 20   Ht '5\' 5"'$  (1.651 m)   Wt (!) 145.4 kg   LMP 01/14/2021   SpO2 98%   BMI 53.33 kg/m  GENERAL: Well-developed, well-nourished female in no acute distress.  HEAD: Normocephalic, atraumatic.  CHEST: Normal effort of breathing, regular heart rate ABDOMEN: Soft, nontender, gravid PELVIC: Normal external female genitalia. Vagina is pink and rugated. Cervix with normal contour, no lesions. Normal discharge. Negative for pooling    Cervical exam:   Dilation: 1 Effacement (%): 70 Station: 0 Exam by:: s Trent Theisen cnm  Fetal Monitoring: Baseline: 145 bpm  Variability: moderate  Accelerations: present with 15x15 accels  Decelerations: absent  Contractions: occasional and irregular   - @ 1806 Upon reassessment  - patient states she still not felt baby move "as much as normal"  - Sending Patient for a BPP.   - Ctx now consistent q2-3 on EFM.  - PO Tylenol and Flexeril given    Reassessment @ 1905 - FHT Remained Cat I no Decels Toco: ctx 3-4 mins.  - BPP 8/8 with reactive NST 10/10 [id number] Cephalic presentation.  - Patient continues to endorse some mild cramping.    - Patient hit clicker > 20 times while in MAU. - Dilation: 1 Effacement (%): 70 Station: 0 Presentation: Vertex Exam by:: SHuntley Dec CNM   A: SIUP at 345w3dMembranes intact - Cervix unchanged.  - Plan to discharge home    P: 1. False labor   2.  Intact amniotic membranes during pregnancy in third trimester   3. [redacted] weeks gestation of pregnancy    - Discussed that this is likely early labor.  - Discussed difference between early and active labor.  - Return precautions and worsening signs and symptoms reviewed.  - FHT Cat I upon discharge.  - Term labor precautions reviewed.  - Patient has scheduled C/s for this Friday 10/01/21. Encouraged to keep office appointments and schedule for C/S.  - Patient discharged home in stable condition and may return to MAU as needed    PaDeloris PingCNNorth Dakota/11/2021 5:08 PM

## 2021-09-26 NOTE — MAU Note (Signed)
Kimberly Prince is a 31 y.o. at 34w3dhere in MAU reporting: having pretty severe cramping. Enough that she called her dr.  Has noted wetness, leaving wet spots all over, has not felt a gush.  Last night was very active, more than ever before, today it has been kind of slow or less than usual. No bleeding  Onset of complaint: cramping started this morning, got bad around 2, noted leaking 0930 Pain score: 5 Vitals:   09/26/21 1632  BP: 130/81  Pulse: (!) 112  Resp: 20  Temp: 98.1 F (36.7 C)  SpO2: 98%     FHT:152 Lab orders placed from triage: fern  Scheduled for a repeat c/s on Fri.  GHTN

## 2021-09-28 ENCOUNTER — Ambulatory Visit: Payer: BC Managed Care – PPO

## 2021-09-28 ENCOUNTER — Other Ambulatory Visit (HOSPITAL_COMMUNITY)
Admission: RE | Admit: 2021-09-28 | Discharge: 2021-09-28 | Disposition: A | Discharge status: Home / Self Care | Payer: BC Managed Care – PPO | Source: Ambulatory Visit | Attending: Obstetrics and Gynecology | Admitting: Obstetrics and Gynecology

## 2021-09-28 DIAGNOSIS — Z01812 Encounter for preprocedural laboratory examination: Secondary | ICD-10-CM | POA: Insufficient documentation

## 2021-09-28 LAB — CBC
HCT: 31.5 % — ABNORMAL LOW (ref 36.0–46.0)
Hemoglobin: 9.5 g/dL — ABNORMAL LOW (ref 12.0–15.0)
MCH: 24.4 pg — ABNORMAL LOW (ref 26.0–34.0)
MCHC: 30.2 g/dL (ref 30.0–36.0)
MCV: 81 fL (ref 80.0–100.0)
Platelets: 239 10*3/uL (ref 150–400)
RBC: 3.89 MIL/uL (ref 3.87–5.11)
RDW: 13.9 % (ref 11.5–15.5)
WBC: 10.3 10*3/uL (ref 4.0–10.5)
nRBC: 0 % (ref 0.0–0.2)

## 2021-09-28 LAB — TYPE AND SCREEN
ABO/RH(D): A POS
Antibody Screen: NEGATIVE

## 2021-09-28 LAB — RPR: RPR Ser Ql: NONREACTIVE

## 2021-09-29 ENCOUNTER — Other Ambulatory Visit: Payer: Self-pay | Admitting: Obstetrics and Gynecology

## 2021-09-29 MED ORDER — BUPIVACAINE LIPOSOME 1.3 % IJ SUSP: 20.0000 mL | Freq: Once | INTRAMUSCULAR | Status: AC

## 2021-09-29 NOTE — H&P (Signed)
  The note originally documented on this encounter has been moved the the encounter in which it belongs.  

## 2021-09-30 ENCOUNTER — Inpatient Hospital Stay (HOSPITAL_COMMUNITY): Payer: BC Managed Care – PPO | Admitting: Anesthesiology

## 2021-09-30 ENCOUNTER — Other Ambulatory Visit: Payer: Self-pay

## 2021-09-30 ENCOUNTER — Inpatient Hospital Stay (HOSPITAL_COMMUNITY)
Admission: RE | Admit: 2021-09-30 | Discharge: 2021-10-02 | DRG: 787 | Disposition: A | Discharge status: Home / Self Care | Payer: BC Managed Care – PPO | Attending: Obstetrics and Gynecology | Admitting: Obstetrics and Gynecology

## 2021-09-30 ENCOUNTER — Encounter (HOSPITAL_COMMUNITY)
Admission: RE | Disposition: A | Discharge status: Home / Self Care | Payer: Self-pay | Source: Home / Self Care | Attending: Obstetrics and Gynecology

## 2021-09-30 ENCOUNTER — Encounter (HOSPITAL_COMMUNITY): Payer: Self-pay | Admitting: Obstetrics and Gynecology

## 2021-09-30 DIAGNOSIS — O34211 Maternal care for low transverse scar from previous cesarean delivery: Principal | ICD-10-CM | POA: Diagnosis present

## 2021-09-30 DIAGNOSIS — O9081 Anemia of the puerperium: Secondary | ICD-10-CM | POA: Diagnosis not present

## 2021-09-30 DIAGNOSIS — Z98891 History of uterine scar from previous surgery: Principal | ICD-10-CM

## 2021-09-30 DIAGNOSIS — O134 Gestational [pregnancy-induced] hypertension without significant proteinuria, complicating childbirth: Secondary | ICD-10-CM | POA: Diagnosis present

## 2021-09-30 DIAGNOSIS — D62 Acute posthemorrhagic anemia: Secondary | ICD-10-CM | POA: Diagnosis not present

## 2021-09-30 DIAGNOSIS — Z3A37 37 weeks gestation of pregnancy: Secondary | ICD-10-CM | POA: Diagnosis not present

## 2021-09-30 DIAGNOSIS — O99214 Obesity complicating childbirth: Secondary | ICD-10-CM | POA: Diagnosis present

## 2021-09-30 SURGERY — Surgical Case
Anesthesia: Spinal

## 2021-09-30 MED ORDER — CEFAZOLIN IN SODIUM CHLORIDE 3-0.9 GM/100ML-% IV SOLN
INTRAVENOUS | Status: AC
  Filled 2021-09-30: qty 100

## 2021-09-30 MED ORDER — SCOPOLAMINE 1 MG/3DAYS TD PT72
1.0000 | MEDICATED_PATCH | TRANSDERMAL | Status: DC
  Administered 2021-09-30: 1.5 mg via TRANSDERMAL

## 2021-09-30 MED ORDER — OXYTOCIN-SODIUM CHLORIDE 30-0.9 UT/500ML-% IV SOLN
INTRAVENOUS | Status: DC | PRN
  Administered 2021-09-30: 300 mL via INTRAVENOUS

## 2021-09-30 MED ORDER — MAGNESIUM HYDROXIDE 400 MG/5ML PO SUSP: 30.0000 mL | ORAL | Status: DC | PRN

## 2021-09-30 MED ORDER — STERILE WATER FOR IRRIGATION IR SOLN
Status: DC | PRN
  Administered 2021-09-30: 1

## 2021-09-30 MED ORDER — NALOXONE HCL 0.4 MG/ML IJ SOLN: 0.4000 mg | INTRAMUSCULAR | Status: DC | PRN

## 2021-09-30 MED ORDER — SOD CITRATE-CITRIC ACID 500-334 MG/5ML PO SOLN: 30.0000 mL | Freq: Once | ORAL | Status: DC

## 2021-09-30 MED ORDER — FERROUS SULFATE 325 (65 FE) MG PO TABS
325.0000 mg | ORAL_TABLET | Freq: Two times a day (BID) | ORAL | Status: DC
  Administered 2021-10-01 – 2021-10-02 (×3): 325 mg via ORAL
  Filled 2021-09-30 (×3): qty 1

## 2021-09-30 MED ORDER — ACETAMINOPHEN 10 MG/ML IV SOLN
INTRAVENOUS | Status: AC
  Filled 2021-09-30: qty 100

## 2021-09-30 MED ORDER — SOD CITRATE-CITRIC ACID 500-334 MG/5ML PO SOLN
30.0000 mL | ORAL | Status: AC
  Administered 2021-09-30: 30 mL via ORAL

## 2021-09-30 MED ORDER — SCOPOLAMINE 1 MG/3DAYS TD PT72: 1.0000 | MEDICATED_PATCH | TRANSDERMAL | Status: DC

## 2021-09-30 MED ORDER — PHENYLEPHRINE HCL (PRESSORS) 10 MG/ML IV SOLN
INTRAVENOUS | Status: DC | PRN
  Administered 2021-09-30: 240 ug via INTRAVENOUS
  Administered 2021-09-30: 160 ug via INTRAVENOUS
  Administered 2021-09-30: 80 ug via INTRAVENOUS
  Administered 2021-09-30 (×2): 160 ug via INTRAVENOUS
  Administered 2021-09-30: 240 ug via INTRAVENOUS

## 2021-09-30 MED ORDER — ACETAMINOPHEN 500 MG PO TABS
ORAL_TABLET | ORAL | Status: AC
  Filled 2021-09-30: qty 2

## 2021-09-30 MED ORDER — ONDANSETRON HCL 4 MG/2ML IJ SOLN
INTRAMUSCULAR | Status: DC | PRN
  Administered 2021-09-30: 4 mg via INTRAVENOUS

## 2021-09-30 MED ORDER — MORPHINE SULFATE (PF) 0.5 MG/ML IJ SOLN
INTRAMUSCULAR | Status: AC
  Filled 2021-09-30: qty 10

## 2021-09-30 MED ORDER — NALOXONE HCL 4 MG/10ML IJ SOLN: 1.0000 ug/kg/h | INTRAVENOUS | Status: DC | PRN

## 2021-09-30 MED ORDER — ACETAMINOPHEN 500 MG PO TABS
1000.0000 mg | ORAL_TABLET | Freq: Four times a day (QID) | ORAL | Status: DC
  Administered 2021-09-30 – 2021-10-02 (×7): 1000 mg via ORAL
  Filled 2021-09-30 (×9): qty 2

## 2021-09-30 MED ORDER — PHENYLEPHRINE HCL-NACL 20-0.9 MG/250ML-% IV SOLN
INTRAVENOUS | Status: DC | PRN
  Administered 2021-09-30: 60 ug/min via INTRAVENOUS

## 2021-09-30 MED ORDER — ALBUMIN HUMAN 5 % IV SOLN: INTRAVENOUS | Status: DC | PRN

## 2021-09-30 MED ORDER — PHENYLEPHRINE 80 MCG/ML (10ML) SYRINGE FOR IV PUSH (FOR BLOOD PRESSURE SUPPORT)
PREFILLED_SYRINGE | INTRAVENOUS | Status: AC
  Filled 2021-09-30: qty 10

## 2021-09-30 MED ORDER — ZOLPIDEM TARTRATE 5 MG PO TABS: 5.0000 mg | ORAL_TABLET | Freq: Every evening | ORAL | Status: DC | PRN

## 2021-09-30 MED ORDER — OXYTOCIN-SODIUM CHLORIDE 30-0.9 UT/500ML-% IV SOLN
INTRAVENOUS | Status: AC
  Filled 2021-09-30: qty 500

## 2021-09-30 MED ORDER — POVIDONE-IODINE 10 % EX SWAB
2.0000 | Freq: Once | CUTANEOUS | Status: AC
  Administered 2021-09-30: 2 via TOPICAL

## 2021-09-30 MED ORDER — SODIUM CHLORIDE 0.9% FLUSH: 3.0000 mL | INTRAVENOUS | Status: DC | PRN

## 2021-09-30 MED ORDER — ONDANSETRON HCL 4 MG/2ML IJ SOLN: 4.0000 mg | Freq: Three times a day (TID) | INTRAMUSCULAR | Status: DC | PRN

## 2021-09-30 MED ORDER — METOCLOPRAMIDE HCL 5 MG/ML IJ SOLN
INTRAMUSCULAR | Status: AC
  Filled 2021-09-30: qty 2

## 2021-09-30 MED ORDER — WITCH HAZEL-GLYCERIN EX PADS: 1.0000 | MEDICATED_PAD | CUTANEOUS | Status: DC | PRN

## 2021-09-30 MED ORDER — DIPHENHYDRAMINE HCL 50 MG/ML IJ SOLN: 12.5000 mg | INTRAMUSCULAR | Status: DC | PRN

## 2021-09-30 MED ORDER — IBUPROFEN 600 MG PO TABS
600.0000 mg | ORAL_TABLET | Freq: Four times a day (QID) | ORAL | Status: DC
  Administered 2021-10-01 – 2021-10-02 (×4): 600 mg via ORAL
  Filled 2021-09-30 (×5): qty 1

## 2021-09-30 MED ORDER — DIPHENHYDRAMINE HCL 25 MG PO CAPS: 25.0000 mg | ORAL_CAPSULE | Freq: Four times a day (QID) | ORAL | Status: DC | PRN

## 2021-09-30 MED ORDER — ALBUMIN HUMAN 5 % IV SOLN
INTRAVENOUS | Status: AC
  Filled 2021-09-30: qty 250

## 2021-09-30 MED ORDER — SERTRALINE HCL 100 MG PO TABS
100.0000 mg | ORAL_TABLET | Freq: Every day | ORAL | Status: DC
  Administered 2021-10-01 (×2): 100 mg via ORAL
  Filled 2021-09-30 (×2): qty 1

## 2021-09-30 MED ORDER — FENTANYL CITRATE (PF) 100 MCG/2ML IJ SOLN
INTRAMUSCULAR | Status: DC | PRN
  Administered 2021-09-30: 15 ug via INTRATHECAL

## 2021-09-30 MED ORDER — BUPIVACAINE IN DEXTROSE 0.75-8.25 % IT SOLN
INTRATHECAL | Status: DC | PRN
  Administered 2021-09-30: 1.8 mL via INTRATHECAL

## 2021-09-30 MED ORDER — HYDROMORPHONE HCL 1 MG/ML IJ SOLN: 0.2000 mg | INTRAMUSCULAR | Status: DC | PRN

## 2021-09-30 MED ORDER — OXYTOCIN-SODIUM CHLORIDE 30-0.9 UT/500ML-% IV SOLN
2.5000 [IU]/h | INTRAVENOUS | Status: AC
  Administered 2021-09-30: 2.5 [IU]/h via INTRAVENOUS
  Filled 2021-09-30: qty 500

## 2021-09-30 MED ORDER — KETOROLAC TROMETHAMINE 30 MG/ML IJ SOLN
INTRAMUSCULAR | Status: AC
  Filled 2021-09-30: qty 1

## 2021-09-30 MED ORDER — ONDANSETRON HCL 4 MG/2ML IJ SOLN
INTRAMUSCULAR | Status: AC
  Filled 2021-09-30: qty 2

## 2021-09-30 MED ORDER — SCOPOLAMINE 1 MG/3DAYS TD PT72
MEDICATED_PATCH | TRANSDERMAL | Status: AC
  Filled 2021-09-30: qty 1

## 2021-09-30 MED ORDER — SODIUM CHLORIDE 0.9 % IR SOLN
Status: DC | PRN
  Administered 2021-09-30: 1000 mL

## 2021-09-30 MED ORDER — MEPERIDINE HCL 25 MG/ML IJ SOLN: 6.2500 mg | INTRAMUSCULAR | Status: DC | PRN

## 2021-09-30 MED ORDER — CEFAZOLIN IN SODIUM CHLORIDE 3-0.9 GM/100ML-% IV SOLN
3.0000 g | INTRAVENOUS | Status: AC
  Administered 2021-09-30: 3 g via INTRAVENOUS

## 2021-09-30 MED ORDER — DIPHENHYDRAMINE HCL 25 MG PO CAPS: 25.0000 mg | ORAL_CAPSULE | ORAL | Status: DC | PRN

## 2021-09-30 MED ORDER — METOCLOPRAMIDE HCL 5 MG/ML IJ SOLN
INTRAMUSCULAR | Status: DC | PRN
  Administered 2021-09-30: 10 mg via INTRAVENOUS

## 2021-09-30 MED ORDER — SOD CITRATE-CITRIC ACID 500-334 MG/5ML PO SOLN
ORAL | Status: AC
  Filled 2021-09-30: qty 30

## 2021-09-30 MED ORDER — SIMETHICONE 80 MG PO CHEW
80.0000 mg | CHEWABLE_TABLET | ORAL | Status: DC | PRN
  Administered 2021-10-01: 80 mg via ORAL
  Filled 2021-09-30: qty 1

## 2021-09-30 MED ORDER — PHENYLEPHRINE HCL-NACL 20-0.9 MG/250ML-% IV SOLN
INTRAVENOUS | Status: AC
  Filled 2021-09-30: qty 250

## 2021-09-30 MED ORDER — TRANEXAMIC ACID-NACL 1000-0.7 MG/100ML-% IV SOLN
INTRAVENOUS | Status: AC
  Filled 2021-09-30: qty 100

## 2021-09-30 MED ORDER — SIMETHICONE 80 MG PO CHEW
80.0000 mg | CHEWABLE_TABLET | Freq: Three times a day (TID) | ORAL | Status: DC
  Administered 2021-10-01 – 2021-10-02 (×5): 80 mg via ORAL
  Filled 2021-09-30 (×5): qty 1

## 2021-09-30 MED ORDER — TRANEXAMIC ACID-NACL 1000-0.7 MG/100ML-% IV SOLN
INTRAVENOUS | Status: DC | PRN
  Administered 2021-09-30: 1000 mg via INTRAVENOUS

## 2021-09-30 MED ORDER — DEXAMETHASONE SODIUM PHOSPHATE 4 MG/ML IJ SOLN
INTRAMUSCULAR | Status: AC
  Filled 2021-09-30: qty 2

## 2021-09-30 MED ORDER — LACTATED RINGERS IV SOLN: INTRAVENOUS | Status: DC

## 2021-09-30 MED ORDER — FENTANYL CITRATE (PF) 100 MCG/2ML IJ SOLN: 25.0000 ug | INTRAMUSCULAR | Status: DC | PRN

## 2021-09-30 MED ORDER — SENNOSIDES-DOCUSATE SODIUM 8.6-50 MG PO TABS
2.0000 | ORAL_TABLET | Freq: Every day | ORAL | Status: DC
  Administered 2021-10-01 – 2021-10-02 (×2): 2 via ORAL
  Filled 2021-09-30 (×2): qty 2

## 2021-09-30 MED ORDER — DIBUCAINE (PERIANAL) 1 % EX OINT: 1.0000 | TOPICAL_OINTMENT | CUTANEOUS | Status: DC | PRN

## 2021-09-30 MED ORDER — DIPHENHYDRAMINE HCL 50 MG/ML IJ SOLN
INTRAMUSCULAR | Status: DC | PRN
  Administered 2021-09-30: 6.25 mg via INTRAVENOUS

## 2021-09-30 MED ORDER — OXYCODONE HCL 5 MG PO TABS
5.0000 mg | ORAL_TABLET | ORAL | Status: DC | PRN
  Administered 2021-10-01 (×2): 5 mg via ORAL
  Filled 2021-09-30 (×2): qty 1

## 2021-09-30 MED ORDER — KETOROLAC TROMETHAMINE 30 MG/ML IJ SOLN
30.0000 mg | Freq: Four times a day (QID) | INTRAMUSCULAR | Status: AC | PRN
  Administered 2021-09-30 (×2): 30 mg via INTRAVENOUS
  Filled 2021-09-30: qty 1

## 2021-09-30 MED ORDER — KETOROLAC TROMETHAMINE 30 MG/ML IJ SOLN: 30.0000 mg | Freq: Four times a day (QID) | INTRAMUSCULAR | Status: AC | PRN

## 2021-09-30 MED ORDER — PRENATAL MULTIVITAMIN CH
1.0000 | ORAL_TABLET | Freq: Every day | ORAL | Status: DC
  Administered 2021-10-02: 1 via ORAL
  Filled 2021-09-30: qty 1

## 2021-09-30 MED ORDER — MENTHOL 3 MG MT LOZG: 1.0000 | LOZENGE | OROMUCOSAL | Status: DC | PRN

## 2021-09-30 MED ORDER — COCONUT OIL OIL: 1.0000 | TOPICAL_OIL | Status: DC | PRN

## 2021-09-30 MED ORDER — FENTANYL CITRATE (PF) 100 MCG/2ML IJ SOLN
INTRAMUSCULAR | Status: AC
  Filled 2021-09-30: qty 2

## 2021-09-30 MED ORDER — ACETAMINOPHEN 500 MG PO TABS
1000.0000 mg | ORAL_TABLET | ORAL | Status: AC
  Administered 2021-09-30: 1000 mg via ORAL

## 2021-09-30 MED ORDER — MORPHINE SULFATE (PF) 0.5 MG/ML IJ SOLN
INTRAMUSCULAR | Status: DC | PRN
  Administered 2021-09-30: .15 mg via INTRATHECAL

## 2021-09-30 SURGICAL SUPPLY — 46 items
BARRIER ADHS 3X4 INTERCEED (GAUZE/BANDAGES/DRESSINGS) IMPLANT
BENZOIN TINCTURE PRP APPL 2/3 (GAUZE/BANDAGES/DRESSINGS) ×1 IMPLANT
BINDER ABDOMINAL 10 UNV 27-48 (MISCELLANEOUS) IMPLANT
BINDER ABDOMINAL 12 ML 46-62 (SOFTGOODS) IMPLANT
CHLORAPREP W/TINT 26ML (MISCELLANEOUS) ×2 IMPLANT
CLAMP CORD UMBIL (MISCELLANEOUS) ×1 IMPLANT
CLOSURE STERI STRIP 1/2 X4 (GAUZE/BANDAGES/DRESSINGS) IMPLANT
CLOTH BEACON ORANGE TIMEOUT ST (SAFETY) ×1 IMPLANT
COVER LIGHT HANDLE  1/PK (MISCELLANEOUS) ×1
COVER LIGHT HANDLE 1/PK (MISCELLANEOUS) IMPLANT
DRSG OPSITE POSTOP 4X10 (GAUZE/BANDAGES/DRESSINGS) ×1 IMPLANT
ELECT REM PT RETURN 9FT ADLT (ELECTROSURGICAL) ×1
ELECTRODE REM PT RTRN 9FT ADLT (ELECTROSURGICAL) ×1 IMPLANT
EXTRACTOR VACUUM KIWI (MISCELLANEOUS) IMPLANT
GLOVE BIOGEL M 7.0 STRL (GLOVE) ×2 IMPLANT
GLOVE BIOGEL PI IND STRL 7.0 (GLOVE) ×3 IMPLANT
GOWN STRL REUS W/TWL LRG LVL3 (GOWN DISPOSABLE) ×3 IMPLANT
HEMOSTAT ARISTA ABSORB 3G PWDR (HEMOSTASIS) IMPLANT
KIT ABG SYR 3ML LUER SLIP (SYRINGE) IMPLANT
MAT PREVALON FULL STRYKER (MISCELLANEOUS) IMPLANT
NDL HYPO 25X5/8 SAFETYGLIDE (NEEDLE) IMPLANT
NEEDLE HYPO 25X5/8 SAFETYGLIDE (NEEDLE) IMPLANT
NS IRRIG 1000ML POUR BTL (IV SOLUTION) ×1 IMPLANT
PACK C SECTION WH (CUSTOM PROCEDURE TRAY) ×1 IMPLANT
PAD OB MATERNITY 4.3X12.25 (PERSONAL CARE ITEMS) ×1 IMPLANT
RETAINER VISCERAL (MISCELLANEOUS) IMPLANT
RETRACTOR TRAXI PANNICULUS (MISCELLANEOUS) IMPLANT
RTRCTR C-SECT PINK 25CM LRG (MISCELLANEOUS) IMPLANT
SPONGE T-LAP 18X18 ~~LOC~~+RFID (SPONGE) IMPLANT
STRIP CLOSURE SKIN 1/2X4 (GAUZE/BANDAGES/DRESSINGS) ×1 IMPLANT
SUT MNCRL 0 VIOLET CTX 36 (SUTURE) ×2 IMPLANT
SUT MONOCRYL 0 CTX 36 (SUTURE) ×4
SUT PDS AB 0 CT1 27 (SUTURE) ×2 IMPLANT
SUT PLAIN 0 NONE (SUTURE) IMPLANT
SUT PLAIN 2 0 XLH (SUTURE) IMPLANT
SUT VIC AB 0 CT1 36 (SUTURE) IMPLANT
SUT VIC AB 2-0 CT1 27 (SUTURE) ×1
SUT VIC AB 2-0 CT1 TAPERPNT 27 (SUTURE) ×1 IMPLANT
SUT VIC AB 3-0 SH 27 (SUTURE)
SUT VIC AB 3-0 SH 27X BRD (SUTURE) IMPLANT
SUT VIC AB 4-0 KS 27 (SUTURE) ×1 IMPLANT
SUT VICRYL 0 TIES 12 18 (SUTURE) IMPLANT
TOWEL OR 17X24 6PK STRL BLUE (TOWEL DISPOSABLE) ×1 IMPLANT
TRAXI PANNICULUS RETRACTOR (MISCELLANEOUS) ×1
TRAY FOLEY W/BAG SLVR 14FR LF (SET/KITS/TRAYS/PACK) ×1 IMPLANT
WATER STERILE IRR 1000ML POUR (IV SOLUTION) ×1 IMPLANT

## 2021-09-30 NOTE — Anesthesia Preprocedure Evaluation (Addendum)
Anesthesia Evaluation  Patient identified by MRN, date of birth, ID band Patient awake    Reviewed: Allergy & Precautions, NPO status , Patient's Chart, lab work & pertinent test results  Airway Mallampati: III  TM Distance: >3 FB Neck ROM: Full    Dental no notable dental hx. (+) Teeth Intact, Dental Advisory Given   Pulmonary neg pulmonary ROS,    Pulmonary exam normal breath sounds clear to auscultation       Cardiovascular hypertension, Pt. on medications Normal cardiovascular exam Rhythm:Regular Rate:Normal     Neuro/Psych Anxiety negative neurological ROS     GI/Hepatic Neg liver ROS, GERD  Medicated,  Endo/Other  Morbid obesity  Renal/GU negative Renal ROS  negative genitourinary   Musculoskeletal   Abdominal (+) + obese,   Peds  Hematology  (+) Blood dyscrasia, anemia ,   Anesthesia Other Findings   Reproductive/Obstetrics (+) Pregnancy Hx/o previous C/Section for Breech presentation Gestational HTN                            Anesthesia Physical Anesthesia Plan  ASA: 3  Anesthesia Plan: Spinal   Post-op Pain Management:    Induction: Intravenous  PONV Risk Score and Plan: Treatment may vary due to age or medical condition and Scopolamine patch - Pre-op  Airway Management Planned: Natural Airway  Additional Equipment: None  Intra-op Plan:   Post-operative Plan:   Informed Consent: I have reviewed the patients History and Physical, chart, labs and discussed the procedure including the risks, benefits and alternatives for the proposed anesthesia with the patient or authorized representative who has indicated his/her understanding and acceptance.     Dental advisory given  Plan Discussed with: CRNA and Anesthesiologist  Anesthesia Plan Comments:        Anesthesia Quick Evaluation

## 2021-09-30 NOTE — Anesthesia Procedure Notes (Signed)
Spinal  Patient location during procedure: OR Start time: 09/30/2021 1:22 PM End time: 09/30/2021 1:27 PM Reason for block: surgical anesthesia Staffing Anesthesiologist: Josephine Igo, MD Performed by: Josephine Igo, MD Authorized by: Josephine Igo, MD   Preanesthetic Checklist Completed: patient identified, IV checked, site marked, risks and benefits discussed, surgical consent, monitors and equipment checked, pre-op evaluation and timeout performed Spinal Block Patient position: sitting Prep: site prepped and draped Patient monitoring: heart rate, cardiac monitor, continuous pulse ox and blood pressure Approach: midline Location: L3-4 Injection technique: single-shot Needle Needle type: Pencan  Needle gauge: 24 G Needle length: 9 cm Needle insertion depth: 8 cm Assessment Sensory level: T4 Events: paresthesia and CSF return Additional Notes Patient tolerated procedure well. Adequate sensory level. Transient paresthesia in her vagina.

## 2021-09-30 NOTE — Anesthesia Postprocedure Evaluation (Signed)
Anesthesia Post Note  Patient: Kimberly Prince  Procedure(s) Performed: [place]     Patient location during evaluation: PACU Anesthesia Type: Spinal Level of consciousness: oriented and awake and alert Pain management: pain level controlled Vital Signs Assessment: post-procedure vital signs reviewed and stable Respiratory status: spontaneous breathing, respiratory function stable and nonlabored ventilation Cardiovascular status: blood pressure returned to baseline and stable Postop Assessment: no headache, no backache, no apparent nausea or vomiting, spinal receding and patient able to bend at knees Anesthetic complications: no   No notable events documented.  Last Vitals:  Vitals:   09/30/21 1540 09/30/21 1545  BP:  118/64  Pulse: 87 86  Resp: 18 (!) 23  Temp:    SpO2: 100% 97%    Last Pain:  Vitals:   09/30/21 1540  TempSrc:   PainSc: 0-No pain   Pain Goal:    LLE Motor Response: Purposeful movement (09/30/21 1540) LLE Sensation: Tingling (09/30/21 1540) RLE Motor Response: Purposeful movement (09/30/21 1540) RLE Sensation: Tingling (09/30/21 1540)     Epidural/Spinal Function Cutaneous sensation: Tingles (09/30/21 1540), Patient able to flex knees: No (09/30/21 1540), Patient able to lift hips off bed: No (09/30/21 1540), Back pain beyond tenderness at insertion site: No (09/30/21 1540), Progressively worsening motor and/or sensory loss: No (09/30/21 1540), Bowel and/or bladder incontinence post epidural: No (09/30/21 1540)  Kimberly Prince A.

## 2021-09-30 NOTE — Op Note (Signed)
Cesarean Section Procedure Note  Indications:  37 weeks and 0 days with gestational hypertension and history of cesarean section/ pt desires repeat cesarean section   Pre-operative Diagnosis: 37 week 0 day pregnancy.  Post-operative Diagnosis: same  Surgeon: Christophe Louis M.D.  Assistants: Dr. Drema Dallas assisted due to complexity of the anatomy   Anesthesia: Spinal anesthesia  ASA Class: 2   Procedure Details   The patient was seen in the Holding Room. The risks, benefits, complications, treatment options, and expected outcomes were discussed with the patient.  The patient concurred with the proposed plan, giving informed consent.  The site of surgery properly noted/marked. The patient was taken to Operating Room # B, identified as Kimberly Prince and the procedure verified as C-Section Delivery. A Time Out was held and the above information confirmed.  After induction of anesthesia, the patient was draped and prepped in the usual sterile manner. A Pfannenstiel incision was made and carried down through the subcutaneous tissue to the fascia. Fascial incision was made and extended transversely. The fascia was separated from the underlying rectus tissue superiorly and inferiorly. The peritoneum was identified and entered. Peritoneal incision was extended longitudinally. The utero-vesical peritoneal reflection was incised transversely and the bladder flap was bluntly freed from the lower uterine segment. A low transverse uterine incision was made. Delivered from cephalic presentation was a  Female with Apgar scores of 8 at one minute and 9 at five minutes. After the umbilical cord was clamped and cut cord blood was obtained for evaluation. The placenta was removed intact and appeared normal. The uterine outline, tubes and ovaries appeared normal. The uterine incision was closed with running locked sutures of  0 monocryl. A second layer of 0 monocryl was used to imbricate the incision . Hemostasis  was observed. Lavage was carried out until clear. Interceed was placed along the hysterotomy incision. The fascia was then reapproximated with running sutures of  0 pds . The skin was reapproximated with  4-0 vicryl  .  Instrument, sponge, and needle counts were correct prior the abdominal closure and at the conclusion of the case.   Findings: Female fetus in the cephalic presentation Nuchal cord x1 reduced. Clear amniotic fluid   Estimated Blood Loss:   900 mL         Drains: None         Total IV Fluids:  per anesthesia ml         Specimens: Placenta to labor and delivery           Implants: None         Complications:  None; patient tolerated the procedure well.         Disposition: PACU - hemodynamically stable.         Condition: stable  Attending Attestation: I performed the procedure.

## 2021-09-30 NOTE — Transfer of Care (Signed)
Immediate Anesthesia Transfer of Care Note  Patient: Kimberly Prince  Procedure(s) Performed: CESAREAN SECTION  Patient Location: PACU  Anesthesia Type:Spinal  Level of Consciousness: awake, alert  and oriented  Airway & Oxygen Therapy: Patient Spontanous Breathing  Post-op Assessment: Report given to RN and Post -op Vital signs reviewed and stable  Post vital signs: Reviewed and stable  Last Vitals:  Vitals Value Taken Time  BP 106/53 09/30/21 1520  Temp    Pulse 85 09/30/21 1525  Resp 19 09/30/21 1525  SpO2 100 % 09/30/21 1525  Vitals shown include unvalidated device data.  Last Pain:  Vitals:   09/30/21 1109  TempSrc: Oral  PainSc: 0-No pain         Complications: No notable events documented.

## 2021-09-30 NOTE — Lactation Note (Signed)
This note was copied from a baby's chart. Lactation Consultation Note  Patient Name: Kimberly Prince [id number] Date: 09/30/2021 Reason for consult: Initial assessment;Early term 37-38.6wks;Other (Comment) (GHTN, C/S delivery, see Kimberly Prince's MR) Age:30 hours, P2, ETI female infant. Infant had 3 voids and 3 stools since Kimberly. Kimberly Prince was given hand pump to pre-pump breast prior to latching infant and breast shells to wear in bra when not sleeping to help evert nipple shaft out more prior to latching infant at the breast. See Kimberly Prince's nipple type below and past BF experienced in maternal data below. Kimberly Prince pre-pumped breast and latch infant on her right breast infant was on and off breast for 12 minutes. Afterwards LC used breast model to demonstrate hand expression and Kimberly Prince self expressed few drops of colostrum. When Kimberly Prince was using the hand pump she easily expressed 3 mls of colostrum that was given to infant by spoon. Kimberly Prince will continue to work on latching infant at the breast and will ask RN/LC for further latch assistance if needed. LC discussed after 24 hrs of life if infant is not sustaining latch or not breastfeeding well Kimberly Prince may need to use a NS. Kimberly Prince was made aware of O/P services, breastfeeding support groups, community resources, and our phone # for post-discharge questions.   Kimberly Prince feeding plan: 1- Pre-pump with hand pump then continue to BF infant according to hunger cues, on demand, 8 to 12+ times within 24 hrs, STS, ask RN/LC for further latch assistance if needed. 2-If infant doesn't latch to hand express and  give infant back her EBM by spoon. 3- Continue to wear breast shell in bra when not sleeping. Maternal Data Has patient been taught Hand Expression?: Yes Does the patient have breastfeeding experience prior to this delivery?: Yes How long did the patient breastfeed?: Per  Kimberly Prince, 1st child did not  latch at the breast, she was in [facility], she exclusively pumped for 3 months, 1st child is currently 61 years old.  Feeding Mother's Current Feeding Choice: Breast Milk  LATCH Score Latch: Repeated attempts needed to sustain latch, nipple held in mouth throughout feeding, stimulation needed to elicit sucking reflex.  Audible Swallowing: A few with stimulation  Type of Nipple: Flat (LC gave hand pump for Kimberly Prince to pre-pump breast prior to latching infant at the breast and breast shells to wear in bra when not sleeping.)  Comfort (Breast/Nipple): Soft / non-tender  Hold (Positioning): Assistance needed to correctly position infant at breast and maintain latch.  LATCH Score: 6   Lactation Tools Discussed/Used Tools: Flanges;Pump Flange Size: 21 Breast pump type: Manual Pump Education: Setup, frequency, and cleaning;Milk Storage Reason for Pumping: Pre-pump prior to latching infant at the breast. Pumping frequency: Pre-pump prior to latching infant at the breast. Pumped volume: 3 mL (that was spoon fed to infant)  Interventions Interventions: Breast feeding basics reviewed;Assisted with latch;Skin to skin;Breast compression;Hand express;Pre-pump if needed;Adjust position;Support pillows;Position options;Expressed milk;Hand pump;Education;LC Services brochure  Discharge Pump: Personal (Per Kimberly Prince she has [employer] DEBP at home.)  Consult Status Consult Status: Follow-up Date: 10/01/21 Follow-up type: In-patient    Vicente Serene 09/30/2021, 6:55 PM

## 2021-09-30 NOTE — H&P (Signed)
Date of Initial H&P: 09/29/2021  History reviewed, patient examined, no change in status, stable for surgery.

## 2021-10-01 LAB — CBC
HCT: 21.8 % — ABNORMAL LOW (ref 36.0–46.0)
Hemoglobin: 6.7 g/dL — CL (ref 12.0–15.0)
MCH: 24.4 pg — ABNORMAL LOW (ref 26.0–34.0)
MCHC: 30.7 g/dL (ref 30.0–36.0)
MCV: 79.3 fL — ABNORMAL LOW (ref 80.0–100.0)
Platelets: 190 10*3/uL (ref 150–400)
RBC: 2.75 MIL/uL — ABNORMAL LOW (ref 3.87–5.11)
RDW: 14 % (ref 11.5–15.5)
WBC: 13.4 10*3/uL — ABNORMAL HIGH (ref 4.0–10.5)
nRBC: 0 % (ref 0.0–0.2)

## 2021-10-01 LAB — COMPREHENSIVE METABOLIC PANEL
ALT: 14 U/L (ref 0–44)
AST: 23 U/L (ref 15–41)
Albumin: 2.1 g/dL — ABNORMAL LOW (ref 3.5–5.0)
Alkaline Phosphatase: 89 U/L (ref 38–126)
Anion gap: 10 (ref 5–15)
BUN: 6 mg/dL (ref 6–20)
CO2: 20 mmol/L — ABNORMAL LOW (ref 22–32)
Calcium: 8.6 mg/dL — ABNORMAL LOW (ref 8.9–10.3)
Chloride: 99 mmol/L (ref 98–111)
Creatinine, Ser: 0.57 mg/dL (ref 0.44–1.00)
GFR, Estimated: >60 mL/min (ref >60)
Glucose, Bld: 142 mg/dL — ABNORMAL HIGH (ref 70–99)
Potassium: 3.7 mmol/L (ref 3.5–5.1)
Sodium: 129 mmol/L — ABNORMAL LOW (ref 135–145)
Total Bilirubin: 0.4 mg/dL (ref 0.3–1.2)
Total Protein: 5 g/dL — ABNORMAL LOW (ref 6.5–8.1)

## 2021-10-01 MED ORDER — ENOXAPARIN SODIUM 80 MG/0.8ML IJ SOSY
70.0000 mg | PREFILLED_SYRINGE | INTRAMUSCULAR | Status: DC
  Administered 2021-10-01 – 2021-10-02 (×2): 70 mg via SUBCUTANEOUS
  Filled 2021-10-01 (×2): qty 0.8

## 2021-10-01 MED ORDER — SODIUM CHLORIDE 0.9 % IV SOLN
500.0000 mg | Freq: Once | INTRAVENOUS | Status: AC
  Administered 2021-10-01: 500 mg via INTRAVENOUS
  Filled 2021-10-01: qty 500

## 2021-10-01 MED ORDER — ENOXAPARIN SODIUM 40 MG/0.4ML IJ SOSY: 40.0000 mg | PREFILLED_SYRINGE | INTRAMUSCULAR | Status: DC

## 2021-10-01 NOTE — Progress Notes (Signed)
MOB was referred for history of depression/anxiety. * Referral screened out by Clinical Social Worker because none of the following criteria appear to apply: ~ History of anxiety/depression during this pregnancy, or of post-partum depression following prior delivery. ~ Diagnosis of anxiety and/or depression within last 3 years OR * MOB's symptoms currently being treated with medication and/or therapy. MOB has an active prescription for Zoloft '100mg'$ /day. No concerns noted in MOB's [facility] records. Edinburgh score of 1 during current hospitalization.   Please contact the Clinical Social Worker if needs arise, by [facility] request, or if MOB scores greater than 9/yes to question 10 on Edinburgh Postpartum Depression Screen.  Signed,  Berniece Salines, MSW, LCSWA, LCASA 10/01/2021 9:43 AM

## 2021-10-01 NOTE — Progress Notes (Signed)
Postpartum Note Day #1  S:  Patient doing well.  Pain controlled.  Tolerating regular diet.   Ambulating without difficulty. Foley catheter just removed and has not voided yet. Denies light-headedness or dizziness with ambulation.  Denies fevers, chills, chest pain, SOB, N/V, or worsening bilateral LE edema.  Lochia: Minimal Infant feeding:  Breast Circumcision:  N/A, female infant Contraception:  To be discussed at her PPV  O: Temp:  [97.7 F (36.5 C)-99.1 F (37.3 C)] 98 F (36.7 C) (09/16 0259) Pulse Rate:  [79-94] 93 (09/16 0259) Resp:  [14-23] 18 (09/15 1844) BP: (100-145)/(42-96) 107/51 (09/16 0259) SpO2:  [96 %-100 %] 99 % (09/16 0259) Gen: NAD, pleasant and cooperative, standing and ambulating  Resp: No increased work of breathing Abdomen: soft, non-distended, non-tender throughout Uterus: firm, non-tender, below umbilicus Incision: c/d/i, bandage in place  Ext: Trace bilateral LE edema, no bilateral calf tenderness  Labs:  Recent Labs    09/28/21 1105 10/01/21 0420  HGB 9.5* 6.7*  HCT 31.5* 21.8*    A/P: Patient is a 31 y.o. [id number] POD#1 s/p repeat LTCS for gestational HTN.  S/p LTCS - Pain well controlled  - GU: UOP is adequate - GI: Tolerating regular diet - Activity: encouraged sitting up to chair and ambulation as tolerated - DVT Prophylaxis: SCDs in bed, ambulation, Lovenox prophylaxis - Labs: as above -- IV Venofer ordered for acute blood loss anemia  Gestational HTN - Normotensive to mildly lower blood pressures, last BP 107/51 - No medications  Disposition:  D/C home POD#2-3.   Drema Dallas, DO [phone] (office)

## 2021-10-01 NOTE — Progress Notes (Signed)
1211-Dr Delora Fuel called to say iron infusion was making nauseous. Vital signs stable. No other complaints. Per Dr. Delora Fuel okay to stop infusion

## 2021-10-02 ENCOUNTER — Ambulatory Visit: Payer: Self-pay

## 2021-10-02 MED ORDER — IBUPROFEN 600 MG PO TABS: 600.0000 mg | ORAL_TABLET | Freq: Four times a day (QID) | ORAL | 1 refills | Status: AC | PRN

## 2021-10-02 MED ORDER — OXYCODONE HCL 5 MG PO TABS: 5.0000 mg | ORAL_TABLET | Freq: Four times a day (QID) | ORAL | 0 refills | Status: DC | PRN

## 2021-10-02 NOTE — Discharge Summary (Signed)
Postpartum Discharge Summary  Date of Service: 10/02/21     Patient Name: Kimberly Prince DOB: Feb 24, 1990 MRN: [id number]  Date of admission: 09/30/2021 Delivery date:09/30/2021  Delivering provider: Christophe Louis  Date of discharge: 10/02/2021  Admitting diagnosis: S/P cesarean section [Z98.891] Gestational Hypertension Obesity (BMI 69) History of cesarean section  Intrauterine pregnancy: [redacted]w[redacted]d    Secondary diagnosis:  Principal Problem:   S/P cesarean section Gestational Hypertension Obesity (BMI 53) History of cesarean section  Additional problems: None    Discharge diagnosis: Term Pregnancy Delivered                                              Post partum procedures: Partial IV iron infusion Augmentation: N/A Complications: None  Hospital course: Sceduled C/S   31y.o. yo [id number] admitted to the hospital 09/30/2021 for scheduled cesarean section with the following indication: Elective Repeat, Gestational Hypertension .Delivery details are as follows:  Membrane Rupture Time/Date: 2:01 PM ,09/30/2021   Delivery Method:C-Section, Low Transverse  Details of operation can be found in separate operative note.  Patient had an uncomplicated postpartum course.  She is ambulating, tolerating a regular diet, passing flatus, and urinating well. Patient is discharged home in stable condition on  10/02/21        Newborn Data: Birth date:09/30/2021  Birth time:2:01 PM  Gender:Female  Living status:Living  Apgars:8 ,9  Weight:3100 g     Magnesium Sulfate received: No BMZ received: No Rhophylac:N/A MMR:N/A T-DaP:Given prenatally Flu: No Transfusion:No  Physical exam  Vitals:   10/01/21 1211 10/01/21 1532 10/01/21 2030 10/02/21 0545  BP: (!) 100/57 (!) 97/52 97/72 (!) 116/50  Pulse: 80 73 87 79  Resp: '18 18 16 18  ' Temp: 97.7 F (36.5 C) 98.2 F (36.8 C) 98.1 F (36.7 C) 98.1 F (36.7 C)  TempSrc: Oral Oral Oral Oral  SpO2: 98% 97% 99% 100%   General: alert,  cooperative, and no distress Lochia: appropriate Uterine Fundus: firm Incision: Dressing is clean, dry, and intact DVT Evaluation: No evidence of DVT seen on physical exam. No cords or calf tenderness. Labs: Lab Results  Component Value Date   WBC 13.4 (H) 10/01/2021   HGB 6.7 (LL) 10/01/2021   HCT 21.8 (L) 10/01/2021   MCV 79.3 (L) 10/01/2021   PLT 190 10/01/2021      Latest Ref Rng & Units 10/01/2021    4:20 AM  CMP  Glucose 70 - 99 mg/dL 142   BUN 6 - 20 mg/dL 6   Creatinine 0.44 - 1.00 mg/dL 0.57   Sodium 135 - 145 mmol/L 129   Potassium 3.5 - 5.1 mmol/L 3.7   Chloride 98 - 111 mmol/L 99   CO2 22 - 32 mmol/L 20   Calcium 8.9 - 10.3 mg/dL 8.6   Total Protein 6.5 - 8.1 g/dL 5.0   Total Bilirubin 0.3 - 1.2 mg/dL 0.4   Alkaline Phos 38 - 126 U/L 89   AST 15 - 41 U/L 23   ALT 0 - 44 U/L 14    Edinburgh Score:    09/30/2021    4:55 PM  Edinburgh Postnatal Depression Scale Screening Tool  I have been able to laugh and see the funny side of things. 0  I have looked forward with enjoyment to things. 0  I have blamed myself unnecessarily when things went wrong.  1  I have been anxious or worried for no good reason. 0  I have felt scared or panicky for no good reason. 0  Things have been getting on top of me. 0  I have been so unhappy that I have had difficulty sleeping. 0  I have felt sad or miserable. 0  I have been so unhappy that I have been crying. 0  The thought of harming myself has occurred to me. 0  Edinburgh Postnatal Depression Scale Total 1      After visit meds:  Allergies as of 10/02/2021       Reactions   Sulfa Antibiotics Hives        Medication List     STOP taking these medications    aspirin EC 81 MG tablet       TAKE these medications    acetaminophen 500 MG tablet Commonly known as: TYLENOL Take 1,000 mg by mouth every 6 (six) hours as needed for moderate pain.   ibuprofen 600 MG tablet Commonly known as: ADVIL Take 1 tablet  (600 mg total) by mouth every 6 (six) hours as needed for mild pain, moderate pain or cramping.   oxyCODONE 5 MG immediate release tablet Commonly known as: Oxy IR/ROXICODONE Take 1-2 tablets (5-10 mg total) by mouth every 6 (six) hours as needed for moderate pain, severe pain or breakthrough pain.   prenatal multivitamin Tabs tablet Take 1 tablet by mouth daily.   sertraline 100 MG tablet Commonly known as: ZOLOFT Take 100 mg by mouth daily.         Discharge home in stable condition Infant Feeding: Breast Infant Disposition:home with mother Discharge instruction: per After Visit Summary and Postpartum booklet. Activity: Advance as tolerated. Pelvic rest for 6 weeks.  Diet: routine diet Anticipated Birth Control:  To be discussed at PPV Postpartum Appointment:6 weeks Additional Postpartum F/U: Incision check 2 weeks Future Appointments:No future appointments. Follow up Visit:  Follow-up Information     Christophe Louis, MD Follow up in 2 week(s).   Specialty: Obstetrics and Gynecology Why: Please keep your incision check appointment and your 6 week postpartum visit as previously scheduled. Contact information: [address] [place] [address] [phone]                     10/02/2021 Drema Dallas, DO

## 2021-10-02 NOTE — Lactation Note (Signed)
This note was copied from a baby's chart. Lactation Consultation Note  Patient Name: Kimberly Prince LEXNT'Z Date: 10/02/2021 Reason for consult: Follow-up assessment;Early term 37-38.6wks;Infant weight loss (7 % weight loss, Breast/ formula . per Birth parent baby has been to the breast, and supplementing. Birth parent mentioned this baby is latching where as the 1st baby didn't. LC reviewed BF D/C teaching and resources.) Age:31 hours  Maternal Data    Feeding Mother's Current Feeding Choice: Breast Milk and Formula Nipple Type: Extra Slow Flow   Lactation Tools Discussed/Used Pump Education: Milk Storage  Interventions Interventions: Breast feeding basics reviewed;Education;DEBP;LC Services brochure  Discharge Discharge Education: Engorgement and breast care;Warning signs for feeding baby Pump: DEBP;Personal WIC Program: No  Consult Status Consult Status: Complete Date: 10/02/21    Myer Haff 10/02/2021, 3:23 PM

## 2021-10-02 NOTE — Progress Notes (Signed)
Postpartum Note Day #2  S:  Patient doing well.  Pain controlled.  Tolerating regular diet. Ambulating and voiding without difficulty. Did not tolerate IV iron well yesterday - caused significant nausea. Has oral iron tablets at home which she tolerates well. Desires discharge home today if infant is discharged. Denies light-headedness or dizziness with ambulation.  Denies fevers, chills, chest pain, SOB, N/V, or worsening bilateral LE edema.  Lochia: Minimal Infant feeding:  Breast Circumcision:  N/A, female infant Contraception:  To be discussed at her PPV  O: Temp:  [97.7 F (36.5 C)-98.2 F (36.8 C)] 98.1 F (36.7 C) (09/17 0545) Pulse Rate:  [73-87] 79 (09/17 0545) Resp:  [16-18] 18 (09/17 0545) BP: (97-116)/(50-72) 116/50 (09/17 0545) SpO2:  [97 %-100 %] 100 % (09/17 0545) Gen: NAD, pleasant and cooperative, standing and ambulating  Resp: No increased work of breathing Abdomen: soft, non-distended, non-tender throughout Uterus: firm, non-tender, below umbilicus Incision: c/d/i, bandage in place  Ext: Trace bilateral LE edema, no bilateral calf tenderness  Labs:     Latest Ref Rng & Units 10/01/2021    4:20 AM 09/28/2021   11:05 AM 08/28/2021    2:31 PM  CBC  WBC 4.0 - 10.5 K/uL 13.4  10.3  10.9   Hemoglobin 12.0 - 15.0 g/dL 6.7  9.5  9.0   Hematocrit 36.0 - 46.0 % 21.8  31.5  27.9   Platelets 150 - 400 K/uL 190  239  229     A/P: Patient is a 31 y.o. [id number] POD#1 s/p repeat LTCS for gestational HTN.  S/p LTCS - Pain well controlled  - GU: UOP is adequate - GI: Tolerating regular diet - Activity: encouraged sitting up to chair and ambulation as tolerated - DVT Prophylaxis: SCDs in bed, ambulation, Lovenox prophylaxis - Labs: as above -- s/p partial IV Venofer but discontinued due to nausea, patient is asymptomatic from acute blood loss anemia and is tolerating oral iron.  Gestational HTN - Normotensive to mildly lower blood pressures, last BP 116/50 - No  medications  Disposition:  D/C home today if infant discharged, otherwise D/C home tomorrow.  Drema Dallas, DO [phone] (office)

## 2021-10-08 ENCOUNTER — Telehealth (HOSPITAL_COMMUNITY): Payer: Self-pay

## 2021-10-08 NOTE — Telephone Encounter (Signed)
Patient reports doing really well. "My incision is great. I took my dressing off. I have an incision check with my doctor on Wednesday." Patient declines questions/concerns about her health and healing.  Patient reports that baby is doing awesome, she's really great. Eating, peeing/pooping, and gaining weight well. "She is meeting her goal weight." Baby sleeps in a bassinet. RN reviewed ABC's of safe sleep with patient. Patient declines any questions or concerns about baby.  EPDS score is 0.  Sharyn Lull [facility]  10/08/21,1511

## 2022-05-29 ENCOUNTER — Other Ambulatory Visit: Payer: Self-pay

## 2022-05-29 ENCOUNTER — Encounter (HOSPITAL_BASED_OUTPATIENT_CLINIC_OR_DEPARTMENT_OTHER): Payer: Self-pay | Admitting: Emergency Medicine

## 2022-05-29 ENCOUNTER — Observation Stay (HOSPITAL_BASED_OUTPATIENT_CLINIC_OR_DEPARTMENT_OTHER)
Admission: EM | Admit: 2022-05-29 | Discharge: 2022-05-31 | Disposition: A | Discharge status: Home / Self Care | Payer: BC Managed Care – PPO | Attending: General Surgery | Admitting: General Surgery

## 2022-05-29 ENCOUNTER — Emergency Department (HOSPITAL_BASED_OUTPATIENT_CLINIC_OR_DEPARTMENT_OTHER): Payer: BC Managed Care – PPO

## 2022-05-29 DIAGNOSIS — K8012 Calculus of gallbladder with acute and chronic cholecystitis without obstruction: Secondary | ICD-10-CM | POA: Diagnosis not present

## 2022-05-29 DIAGNOSIS — R1013 Epigastric pain: Secondary | ICD-10-CM | POA: Diagnosis present

## 2022-05-29 DIAGNOSIS — K810 Acute cholecystitis: Secondary | ICD-10-CM | POA: Diagnosis present

## 2022-05-29 DIAGNOSIS — K8020 Calculus of gallbladder without cholecystitis without obstruction: Secondary | ICD-10-CM

## 2022-05-29 DIAGNOSIS — K8050 Calculus of bile duct without cholangitis or cholecystitis without obstruction: Secondary | ICD-10-CM

## 2022-05-29 LAB — CBC
HCT: 40.4 % (ref 36.0–46.0)
Hemoglobin: 12.6 g/dL (ref 12.0–15.0)
MCH: 25.4 pg — ABNORMAL LOW (ref 26.0–34.0)
MCHC: 31.2 g/dL (ref 30.0–36.0)
MCV: 81.3 fL (ref 80.0–100.0)
Platelets: 278 K/uL (ref 150–400)
RBC: 4.97 MIL/uL (ref 3.87–5.11)
RDW: 14.6 % (ref 11.5–15.5)
WBC: 8.3 K/uL (ref 4.0–10.5)
nRBC: 0 % (ref 0.0–0.2)

## 2022-05-29 LAB — URINALYSIS, ROUTINE W REFLEX MICROSCOPIC
Bilirubin Urine: NEGATIVE
Glucose, UA: NEGATIVE mg/dL
Hgb urine dipstick: NEGATIVE
Ketones, ur: NEGATIVE mg/dL
Leukocytes,Ua: NEGATIVE
Nitrite: NEGATIVE
Protein, ur: NEGATIVE mg/dL
Specific Gravity, Urine: 1.017 (ref 1.005–1.030)
pH: 6 (ref 5.0–8.0)

## 2022-05-29 LAB — PREGNANCY, URINE: Preg Test, Ur: NEGATIVE

## 2022-05-29 LAB — COMPREHENSIVE METABOLIC PANEL WITH GFR
ALT: 17 U/L (ref 0–44)
AST: 16 U/L (ref 15–41)
Albumin: 4.6 g/dL (ref 3.5–5.0)
Alkaline Phosphatase: 69 U/L (ref 38–126)
Anion gap: 11 (ref 5–15)
BUN: 12 mg/dL (ref 6–20)
CO2: 23 mmol/L (ref 22–32)
Calcium: 9.5 mg/dL (ref 8.9–10.3)
Chloride: 105 mmol/L (ref 98–111)
Creatinine, Ser: 0.69 mg/dL (ref 0.44–1.00)
GFR, Estimated: >60 mL/min
Glucose, Bld: 105 mg/dL — ABNORMAL HIGH (ref 70–99)
Potassium: 3.4 mmol/L — ABNORMAL LOW (ref 3.5–5.1)
Sodium: 139 mmol/L (ref 135–145)
Total Bilirubin: 0.3 mg/dL (ref 0.3–1.2)
Total Protein: 7.7 g/dL (ref 6.5–8.1)

## 2022-05-29 LAB — LIPASE, BLOOD: Lipase: 62 U/L — ABNORMAL HIGH (ref 11–51)

## 2022-05-29 MED ORDER — FENTANYL CITRATE PF 50 MCG/ML IJ SOSY
50.0000 ug | PREFILLED_SYRINGE | Freq: Once | INTRAMUSCULAR | Status: AC
  Administered 2022-05-29: 50 ug via INTRAVENOUS
  Filled 2022-05-29: qty 1

## 2022-05-29 MED ORDER — ONDANSETRON HCL 4 MG/2ML IJ SOLN
4.0000 mg | Freq: Once | INTRAMUSCULAR | Status: AC
  Administered 2022-05-29: 4 mg via INTRAVENOUS
  Filled 2022-05-29: qty 2

## 2022-05-29 MED ORDER — SODIUM CHLORIDE 0.9 % IV SOLN
2.0000 g | Freq: Once | INTRAVENOUS | Status: AC
  Administered 2022-05-30: 2 g via INTRAVENOUS
  Filled 2022-05-29: qty 20

## 2022-05-29 MED ORDER — IOHEXOL 300 MG/ML  SOLN
100.0000 mL | Freq: Once | INTRAMUSCULAR | Status: AC | PRN
  Administered 2022-05-29: 100 mL via INTRAVENOUS

## 2022-05-29 MED ORDER — MORPHINE SULFATE (PF) 4 MG/ML IV SOLN
4.0000 mg | Freq: Once | INTRAVENOUS | Status: AC
  Administered 2022-05-29: 4 mg via INTRAVENOUS
  Filled 2022-05-29: qty 1

## 2022-05-29 MED ORDER — FENTANYL CITRATE PF 50 MCG/ML IJ SOSY
50.0000 ug | PREFILLED_SYRINGE | INTRAMUSCULAR | Status: DC | PRN
  Administered 2022-05-30: 50 ug via INTRAVENOUS
  Filled 2022-05-29 (×2): qty 1

## 2022-05-29 MED ORDER — SODIUM CHLORIDE 0.9 % IV BOLUS
1000.0000 mL | Freq: Once | INTRAVENOUS | Status: AC
  Administered 2022-05-29: 1000 mL via INTRAVENOUS

## 2022-05-29 MED ORDER — ONDANSETRON HCL 4 MG/2ML IJ SOLN
4.0000 mg | Freq: Four times a day (QID) | INTRAMUSCULAR | Status: DC | PRN
  Administered 2022-05-30 (×2): 4 mg via INTRAVENOUS
  Filled 2022-05-29 (×2): qty 2

## 2022-05-29 NOTE — ED Provider Notes (Signed)
[place] EMERGENCY DEPARTMENT AT [facility] Provider Note   CSN: [id number] Arrival date & time: 05/29/22  4782     History  Chief Complaint  Patient presents with   Abdominal Pain    Kimberly Prince is a 32 y.o. female presents to the ED complaining of severe right upper quadrant and centralized abdominal pain.  Patient states that she has been dealing with right upper quadrant abdominal pain for the past month and has been evaluated by gastroenterology.  Patient states they found gallstones on her ultrasound.  She reports that she was not doing anything in particular when the pain became very severe and had her "doubled over in pain".  Patient had a couple of episodes of vomiting and continues to have nausea.  Denies fever, chills, abdominal distention, diarrhea, constipation, blood in the stool.        Home Medications Prior to Admission medications   Medication Sig Start Date End Date Taking? Authorizing Provider  acetaminophen (TYLENOL) 500 MG tablet Take 1,000 mg by mouth every 6 (six) hours as needed for moderate pain.    [provider]  ibuprofen (ADVIL) 600 MG tablet Take 1 tablet (600 mg total) by mouth every 6 (six) hours as needed for mild pain, moderate pain or cramping. 10/02/21   Steva Ready, DO  oxyCODONE (OXY IR/ROXICODONE) 5 MG immediate release tablet Take 1-2 tablets (5-10 mg total) by mouth every 6 (six) hours as needed for moderate pain, severe pain or breakthrough pain. 10/02/21   Steva Ready, DO  Prenatal Vit-Fe Fumarate-FA (PRENATAL MULTIVITAMIN) TABS tablet Take 1 tablet by mouth daily.    [provider]  sertraline (ZOLOFT) 100 MG tablet Take 100 mg by mouth daily.    [provider]      Allergies    Sulfa antibiotics    Review of Systems   Review of Systems  Constitutional:  Negative for chills and fever.  Gastrointestinal:  Positive for abdominal pain, nausea and vomiting. Negative for abdominal  distention, blood in stool, constipation and diarrhea.    Physical Exam Updated Vital Signs BP 136/85   Pulse 82   Temp 97.9 F (36.6 C) (Temporal)   Resp 16   Ht 5\' 6"  (1.676 m)   Wt 127.9 kg   SpO2 100%   BMI 45.52 kg/m  Physical Exam Vitals and nursing note reviewed.  Constitutional:      General: She is not in acute distress.    Appearance: She is not ill-appearing.  HENT:     Mouth/Throat:     Mouth: Mucous membranes are moist.     Pharynx: Oropharynx is clear.  Cardiovascular:     Rate and Rhythm: Normal rate and regular rhythm.     Pulses: Normal pulses.     Heart sounds: Normal heart sounds.  Pulmonary:     Effort: Pulmonary effort is normal. No respiratory distress.     Breath sounds: Normal breath sounds and air entry.  Abdominal:     General: Abdomen is flat. Bowel sounds are normal. There is no distension.     Palpations: Abdomen is soft.     Tenderness: There is abdominal tenderness in the right upper quadrant and epigastric area. Positive signs include Murphy's sign.  Skin:    General: Skin is warm and dry.     Capillary Refill: Capillary refill takes less than 2 seconds.  Neurological:     Mental Status: She is alert. Mental status is at baseline.  Psychiatric:  Mood and Affect: Mood normal.        Behavior: Behavior normal.     ED Results / Procedures / Treatments   Labs (all labs ordered are listed, but only abnormal results are displayed) Labs Reviewed  LIPASE, BLOOD - Abnormal; Notable for the following components:      Result Value   Lipase 62 (*)    All other components within normal limits  COMPREHENSIVE METABOLIC PANEL - Abnormal; Notable for the following components:   Potassium 3.4 (*)    Glucose, Bld 105 (*)    All other components within normal limits  CBC - Abnormal; Notable for the following components:   MCH 25.4 (*)    All other components within normal limits  URINALYSIS, ROUTINE W REFLEX MICROSCOPIC - Abnormal;  Notable for the following components:   APPearance HAZY (*)    Bacteria, UA RARE (*)    All other components within normal limits  PREGNANCY, URINE    EKG None  Radiology CT ABDOMEN PELVIS W CONTRAST  Result Date: 05/29/2022 CLINICAL DATA:  Acute abdominal pain EXAM: CT ABDOMEN AND PELVIS WITH CONTRAST TECHNIQUE: Multidetector CT imaging of the abdomen and pelvis was performed using the standard protocol following bolus administration of intravenous contrast. RADIATION DOSE REDUCTION: This exam was performed according to the departmental dose-optimization program which includes automated exposure control, adjustment of the mA and/or kV according to patient size and/or use of iterative reconstruction technique. CONTRAST:  OMNIPAQUE IOHEXOL 300 MG/ML  SOLN COMPARISON:  None Available. FINDINGS: Lower chest: No acute abnormality. Hepatobiliary: Liver shows decreased attenuation consistent fatty infiltration. Gallstones are seen without complicating factors. Pancreas: Unremarkable. No pancreatic ductal dilatation or surrounding inflammatory changes. Spleen: Normal in size without focal abnormality. Adrenals/Urinary Tract: Adrenal glands are within normal limits. Kidneys demonstrate a normal enhancement pattern. No renal calculi or obstructive changes are noted. The bladder is decompressed. Stomach/Bowel: The appendix is within normal limits. No obstructive or inflammatory changes of the colon are noted. Small bowel and stomach are unremarkable. Vascular/Lymphatic: No significant vascular findings are present. No enlarged abdominal or pelvic lymph nodes. Reproductive: Uterus and bilateral adnexa are unremarkable. Other: No abdominal wall hernia or abnormality. No abdominopelvic ascites. Musculoskeletal: No acute or significant osseous findings. IMPRESSION: Cholelithiasis without complicating factors. Fatty liver. Electronically Signed   By: Alcide Clever M.D.   On: 05/29/2022 21:22     Procedures Procedures    Medications Ordered in ED Medications  morphine (PF) 4 MG/ML injection 4 mg (4 mg Intravenous Given 05/29/22 2052)  ondansetron (ZOFRAN) injection 4 mg (4 mg Intravenous Given 05/29/22 2052)  sodium chloride 0.9 % bolus 1,000 mL (0 mLs Intravenous Stopped 05/29/22 2228)  iohexol (OMNIPAQUE) 300 MG/ML solution 100 mL (100 mLs Intravenous Contrast Given 05/29/22 2102)  morphine (PF) 4 MG/ML injection 4 mg (4 mg Intravenous Given 05/29/22 2224)    ED Course/ Medical Decision Making/ A&P                             Medical Decision Making Amount and/or Complexity of Data Reviewed Labs: ordered. Radiology: ordered.  Risk Prescription drug management.   This patient presents to the ED with chief complaint(s) of acute abdominal pain with pertinent past medical history of cholelithiasis.  The complaint involves an extensive differential diagnosis and also carries with it a high risk of complications and morbidity.    The differential diagnosis includes acute cholecystitis, appendicitis, pancreatitis, acute gastroenteritis,  gastritis, PUD  The initial plan is to obtain abdominal pain workup  Additional history obtained: Records reviewed  -patient has been evaluated by gastroenterology for RUQ abdominal pain for the past month.  Patient did have an ultrasound performed on 05/26/2022 which showed cholelithiasis.  Patient was also to be scheduled for EGD/colonoscopy due to having weeks of diarrhea.  Patient not currently complaining of diarrhea.  Initial Assessment:   Exam significant for tenderness to palpation of the right upper quadrant and epigastric region.  Positive Murphy sign.  Abdomen is soft and nondistended.  No appreciable rashes, jaundice, or hernias.  Skin is warm and dry.  Patient is not in acute distress, but does appear uncomfortable and in pain.  Independent ECG/labs interpretation:  The following labs were independently interpreted:  CBC without  leukocytosis or anemia.  Lipase is mildly elevated.  UA without evidence of infection.  Metabolic panel with mild hypokalemia, but no other electrolyte disturbance.  LFTs are within normal range.  Renal function is also normal.  Independent visualization and interpretation of imaging: I independently visualized the following imaging with scope of interpretation limited to determining acute life threatening conditions related to emergency care: CT abdomen pelvis, which revealed cholelithiasis without acute cholecystitis.  No pancreatitis.  I agree with radiologist interpretation.  Treatment and Reassessment: Patient given Zofran for nausea and morphine for pain.  Patient continues to have pain despite multiple doses of morphine.  Patient did achieve pain reduction with IV fentanyl.  Consultations obtained: I requested consultation with on-call general surgery provider and spoke with Violeta Gelinas who recommended cholecystectomy given patient's severe abdominal pain.  Patient to be transferred to preop at [facility] sometime tomorrow for surgery.  Disposition:   Patient will be transferred to [facility] preop tomorrow for cholecystectomy.  [facility] will remain in [facility] overnight with nausea and pain being managed.  Patient NPO after midnight.          Final Clinical Impression(s) / ED Diagnoses Final diagnoses:  None    Rx / DC Orders ED Discharge Orders     None         Lenard Simmer, PA-C 05/29/22 2349    Terald Sleeper, MD 05/30/22 1409

## 2022-05-29 NOTE — ED Triage Notes (Signed)
Pt arrives to ED with c/o abdominal pain that started x1 month ago that worsened today.

## 2022-05-30 ENCOUNTER — Encounter (HOSPITAL_COMMUNITY): Payer: Self-pay

## 2022-05-30 ENCOUNTER — Observation Stay (HOSPITAL_COMMUNITY): Payer: BC Managed Care – PPO

## 2022-05-30 DIAGNOSIS — K8012 Calculus of gallbladder with acute and chronic cholecystitis without obstruction: Secondary | ICD-10-CM | POA: Diagnosis not present

## 2022-05-30 DIAGNOSIS — K810 Acute cholecystitis: Secondary | ICD-10-CM | POA: Diagnosis present

## 2022-05-30 DIAGNOSIS — R1013 Epigastric pain: Secondary | ICD-10-CM | POA: Diagnosis present

## 2022-05-30 MED ORDER — MELATONIN 3 MG PO TABS
3.0000 mg | ORAL_TABLET | Freq: Every evening | ORAL | Status: DC | PRN
  Filled 2022-05-30: qty 1

## 2022-05-30 MED ORDER — ONDANSETRON 4 MG PO TBDP: 4.0000 mg | ORAL_TABLET | Freq: Four times a day (QID) | ORAL | Status: DC | PRN

## 2022-05-30 MED ORDER — MORPHINE SULFATE (PF) 4 MG/ML IV SOLN
3.0000 mg | Freq: Once | INTRAVENOUS | Status: AC
  Administered 2022-05-30: 3 mg via INTRAVENOUS

## 2022-05-30 MED ORDER — DIPHENHYDRAMINE HCL 50 MG/ML IJ SOLN: 25.0000 mg | Freq: Four times a day (QID) | INTRAMUSCULAR | Status: DC | PRN

## 2022-05-30 MED ORDER — METOPROLOL TARTRATE 5 MG/5ML IV SOLN: 5.0000 mg | Freq: Four times a day (QID) | INTRAVENOUS | Status: DC | PRN

## 2022-05-30 MED ORDER — ENOXAPARIN SODIUM 40 MG/0.4ML IJ SOSY
40.0000 mg | PREFILLED_SYRINGE | Freq: Every day | INTRAMUSCULAR | Status: DC
  Administered 2022-05-30: 40 mg via SUBCUTANEOUS
  Filled 2022-05-30: qty 0.4

## 2022-05-30 MED ORDER — KCL IN DEXTROSE-NACL 20-5-0.45 MEQ/L-%-% IV SOLN
INTRAVENOUS | Status: DC
  Filled 2022-05-30 (×3): qty 1000

## 2022-05-30 MED ORDER — ENOXAPARIN SODIUM 40 MG/0.4ML IJ SOSY: 40.0000 mg | PREFILLED_SYRINGE | INTRAMUSCULAR | Status: DC

## 2022-05-30 MED ORDER — TECHNETIUM TC 99M MEBROFENIN IV KIT
5.3100 | PACK | Freq: Once | INTRAVENOUS | Status: AC
  Administered 2022-05-30: 5.31 via INTRAVENOUS

## 2022-05-30 MED ORDER — OXYCODONE HCL 5 MG PO TABS
5.0000 mg | ORAL_TABLET | ORAL | Status: DC | PRN
  Administered 2022-05-30 – 2022-05-31 (×2): 10 mg via ORAL
  Filled 2022-05-30 (×2): qty 2

## 2022-05-30 MED ORDER — SIMETHICONE 80 MG PO CHEW
40.0000 mg | CHEWABLE_TABLET | Freq: Four times a day (QID) | ORAL | Status: DC | PRN
  Filled 2022-05-30: qty 1

## 2022-05-30 MED ORDER — MORPHINE SULFATE (PF) 2 MG/ML IV SOLN
1.0000 mg | INTRAVENOUS | Status: DC | PRN
  Administered 2022-05-30: 2 mg via INTRAVENOUS
  Filled 2022-05-30: qty 1

## 2022-05-30 MED ORDER — ONDANSETRON HCL 4 MG/2ML IJ SOLN: 4.0000 mg | Freq: Four times a day (QID) | INTRAMUSCULAR | Status: DC | PRN

## 2022-05-30 MED ORDER — MORPHINE SULFATE (PF) 2 MG/ML IV SOLN
INTRAVENOUS | Status: AC
  Filled 2022-05-30: qty 2

## 2022-05-30 MED ORDER — SODIUM CHLORIDE 0.9 % IV SOLN: 2.0000 g | INTRAVENOUS | Status: DC

## 2022-05-30 MED ORDER — ACETAMINOPHEN 500 MG PO TABS
1000.0000 mg | ORAL_TABLET | Freq: Four times a day (QID) | ORAL | Status: DC
  Administered 2022-05-30 (×3): 1000 mg via ORAL
  Filled 2022-05-30 (×3): qty 2

## 2022-05-30 MED ORDER — SODIUM CHLORIDE 0.9 % IV SOLN
2.0000 g | Freq: Once | INTRAVENOUS | Status: AC
  Administered 2022-05-30: 2 g via INTRAVENOUS
  Filled 2022-05-30: qty 20

## 2022-05-30 MED ORDER — DIPHENHYDRAMINE HCL 25 MG PO CAPS: 25.0000 mg | ORAL_CAPSULE | Freq: Four times a day (QID) | ORAL | Status: DC | PRN

## 2022-05-30 NOTE — H&P (Signed)
Quintessa Quillen October 18, 1990  [id number].    Chief Complaint/Reason for Consult: epigastric abdominal pain  HPI:  This is a 32 yo female with a history of obesity who was started on Ozempic about 6 months ago and has lost 50lbs.  She has developed diarrhea and incontinence of this at times.  She has had some mild epigastric discomfort for the last 5 weeks.  Her Ozempic was stopped about 3 weeks ago.  She has been seeing [facility], Shirleen Schirmer, who has been working her up.  She has had testing for celiac, etc which by report was negative.  She has had an [place] of her gallbladder which revealed gallstones and steatosis.  She is scheduled for EGD/colonoscopy on June 3.    Sunday, she had some incontinence of her stool and some mild mid-epigastric discomfort.  It wasn't awful, but then yesterday afternoon she developed severe epigastric abdominal pain.  She denies any N/V until after arriving at [facility] ED and being given morphine and fentanyl.  She does state she had a taco around 1300.  Prior to this she states she has never noticed that any food has contributed to her symptoms.  At [facility] ED she was found to have a CT with gallstones but normal labs.  She was transferred here for admission.    ROS: ROS: see HPI  Family History  Problem Relation Age of Onset   Hypertension Mother    Healthy Mother    Healthy Father    COPD Maternal Grandmother    Cancer Maternal Grandfather    Cancer Paternal Grandfather     Past Medical History:  Diagnosis Date   Anxiety    Fibroid    Pregnancy induced hypertension    with first pregnancy    Past Surgical History:  Procedure Laterality Date   anastamosis of colon     as an infant   CESAREAN SECTION N/A 09/01/2019   Procedure: CESAREAN SECTION-REQUEST RNFA;  Surgeon: Gerald Leitz, MD;  Location: [facility] LD ORS;  Service: Obstetrics;  Laterality: N/A;   CESAREAN SECTION N/A 09/30/2021   Procedure: CESAREAN SECTION;  Surgeon: Gerald Leitz, MD;  Location:  [facility] LD ORS;  Service: Obstetrics;  Laterality: N/A;   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 She has never used smokeless tobacco. She reports that she does not currently use alcohol. She reports that she does not use drugs.  Allergies:  Allergies  Allergen Reactions   Sulfa Antibiotics Hives    Medications Prior to Admission  Medication Sig Dispense Refill   lisdexamfetamine (VYVANSE) 60 MG capsule Take 60 mg by mouth every morning.     Semaglutide, 1 MG/DOSE, (OZEMPIC, 1 MG/DOSE,) 4 MG/3ML SOPN Inject 1 mg into the skin every 7 (seven) days.     sertraline (ZOLOFT) 100 MG tablet Take 100 mg by mouth daily.     acetaminophen (TYLENOL) 500 MG tablet Take 1,000 mg by mouth every 6 (six) hours as needed for moderate pain. (Patient not taking: Reported on 05/30/2022)     ibuprofen (ADVIL) 600 MG tablet Take 1 tablet (600 mg total) by mouth every 6 (six) hours as needed for mild pain, moderate pain or cramping. (Patient not taking: Reported on 05/30/2022) 30 tablet 1   oxyCODONE (OXY IR/ROXICODONE) 5 MG immediate release tablet Take 1-2 tablets (5-10 mg total) by mouth every  6 (six) hours as needed for moderate pain, severe pain or breakthrough pain. (Patient not taking: Reported on 05/30/2022) 30 tablet 0     Physical Exam: Blood pressure (!) 137/108, pulse 90, temperature 97.7 F (36.5 C), temperature source Oral, resp. rate 16, height 5\' 6"  (1.676 m), weight 127.9 kg, SpO2 100 %, unknown if currently breastfeeding. General: pleasant, WD, obese white female who is laying in bed in NAD HEENT: head is normocephalic, atraumatic.  Sclera are noninjected.  PERRL.  Ears and nose without any masses or lesions.  Mouth is pink and moist Heart: regular, rate, and rhythm.  Normal s1,s2. No obvious murmurs, gallops, or rubs noted.  Palpable radial and pedal pulses bilaterally Lungs: CTAB, no wheezes, rhonchi, or rales  noted.  Respiratory effort nonlabored Abd: soft, mild tenderness across her upper abdomen from left to right side.  No definitive Murphy's sign, greatest tenderness in epigastrium, ND/obese, +BS, no masses, hernias, or organomegaly.  Multiple scars noted from previous surgeries. Psych: A&Ox3 with an appropriate affect.   Results for orders placed or performed during the hospital encounter of 05/29/22 (from the past 48 hour(s))  Lipase, blood     Status: Abnormal   Collection Time: 05/29/22  6:47 PM  Result Value Ref Range   Lipase 62 (H) 11 - 51 U/L    Comment: Performed at [facility], [address], [place], [place] [address]  Comprehensive metabolic panel     Status: Abnormal   Collection Time: 05/29/22  6:47 PM  Result Value Ref Range   Sodium 139 135 - 145 mmol/L   Potassium 3.4 (L) 3.5 - 5.1 mmol/L   Chloride 105 98 - 111 mmol/L   CO2 23 22 - 32 mmol/L   Glucose, Bld 105 (H) 70 - 99 mg/dL    Comment: Glucose reference range applies only to samples taken after fasting for at least 8 hours.   BUN 12 6 - 20 mg/dL   Creatinine, Ser 1.91 0.44 - 1.00 mg/dL   Calcium 9.5 8.9 - 47.8 mg/dL   Total Protein 7.7 6.5 - 8.1 g/dL   Albumin 4.6 3.5 - 5.0 g/dL   AST 16 15 - 41 U/L   ALT 17 0 - 44 U/L   Alkaline Phosphatase 69 38 - 126 U/L   Total Bilirubin 0.3 0.3 - 1.2 mg/dL   GFR, Estimated >29 >56 mL/min    Comment: (NOTE) Calculated using the CKD-EPI Creatinine Equation (2021)    Anion gap 11 5 - 15    Comment: Performed at [facility], [address] [place], [place] [address]  CBC     Status: Abnormal   Collection Time: 05/29/22  6:47 PM  Result Value Ref Range   WBC 8.3 4.0 - 10.5 K/uL   RBC 4.97 3.87 - 5.11 MIL/uL   Hemoglobin 12.6 12.0 - 15.0 g/dL   HCT 65.7 84.6 - 96.2 %   MCV 81.3 80.0 - 100.0 fL   MCH 25.4 (L) 26.0 - 34.0 pg   MCHC 31.2 30.0 - 36.0 g/dL   RDW 95.2 84.1 - 32.4 %   Platelets 278 150 - 400 K/uL   nRBC 0.0 0.0 -  0.2 %    Comment: Performed at [facility], [address], [place], [place] [address]  Urinalysis, Routine w reflex microscopic -Urine, Clean Catch     Status: Abnormal   Collection Time: 05/29/22  7:00 PM  Result Value Ref Range   Color, Urine YELLOW YELLOW  APPearance HAZY (A) CLEAR   Specific Gravity, Urine 1.017 1.005 - 1.030   pH 6.0 5.0 - 8.0   Glucose, UA NEGATIVE NEGATIVE mg/dL   Hgb urine dipstick NEGATIVE NEGATIVE   Bilirubin Urine NEGATIVE NEGATIVE   Ketones, ur NEGATIVE NEGATIVE mg/dL   Protein, ur NEGATIVE NEGATIVE mg/dL   Nitrite NEGATIVE NEGATIVE   Leukocytes,Ua NEGATIVE NEGATIVE   RBC / HPF 0-5 0 - 5 RBC/hpf   WBC, UA 0-5 0 - 5 WBC/hpf   Bacteria, UA RARE (A) NONE SEEN   Squamous Epithelial / HPF 11-20 0 - 5 /HPF   Mucus PRESENT     Comment: Performed at [facility], [address], [place], [place] [address]  Pregnancy, urine     Status: None   Collection Time: 05/29/22  7:00 PM  Result Value Ref Range   Preg Test, Ur NEGATIVE NEGATIVE    Comment:        THE SENSITIVITY OF THIS METHODOLOGY IS >20 mIU/mL. Performed at [facility], [address], [place], [place] [address]    CT ABDOMEN PELVIS W CONTRAST  Result Date: 05/29/2022 CLINICAL DATA:  Acute abdominal pain EXAM: CT ABDOMEN AND PELVIS WITH CONTRAST TECHNIQUE: Multidetector CT imaging of the abdomen and pelvis was performed using the standard protocol following bolus administration of intravenous contrast. RADIATION DOSE REDUCTION: This exam was performed according to the departmental dose-optimization program which includes automated exposure control, adjustment of the mA and/or kV according to patient size and/or use of iterative reconstruction technique. CONTRAST:  OMNIPAQUE IOHEXOL 300 MG/ML  SOLN COMPARISON:  None Available. FINDINGS: Lower chest: No acute abnormality. Hepatobiliary: Liver shows decreased attenuation consistent fatty  infiltration. Gallstones are seen without complicating factors. Pancreas: Unremarkable. No pancreatic ductal dilatation or surrounding inflammatory changes. Spleen: Normal in size without focal abnormality. Adrenals/Urinary Tract: Adrenal glands are within normal limits. Kidneys demonstrate a normal enhancement pattern. No renal calculi or obstructive changes are noted. The bladder is decompressed. Stomach/Bowel: The appendix is within normal limits. No obstructive or inflammatory changes of the colon are noted. Small bowel and stomach are unremarkable. Vascular/Lymphatic: No significant vascular findings are present. No enlarged abdominal or pelvic lymph nodes. Reproductive: Uterus and bilateral adnexa are unremarkable. Other: No abdominal wall hernia or abnormality. No abdominopelvic ascites. Musculoskeletal: No acute or significant osseous findings. IMPRESSION: Cholelithiasis without complicating factors. Fatty liver. Electronically Signed   By: Alcide Clever M.D.   On: 05/29/2022 21:22      Assessment/Plan Epigastric abdominal pain, gallstones, diarrhea The patient has been seen, examined, chart, labs, vitals, and imaging reviewed, along with patient's chart in [facility].  She does have gallstones, but not all of her symptoms are consistent with biliary colic.  She is still pending further GI work up with scheduled EGD/c-scope in June.  It is possible she had biliary colic yesterday and the diarrhea is a separate issue.  We will plan to get a HIDA scan with EF.  If this is abnormal, we will likely plan to remove her gallbladder.  If this is normal, then we will have a discussion with the patient regarding lap chole (knowing this may not correct all of the symptoms she is having) vs continued GI work up and if negative, then plan for lap chole in the near future.  She agrees with this plan of work up.  Will keep NPO for now for HIDA scan today.    FEN - NPO/IVFs VTE - lovenox ID - Rocephin x 1  dose at ED,  no infection present so will stop this Admit - observation  I reviewed Consultant GI notes from [facility] notes, hospitalist notes, last 24 h vitals and pain scores, last 48 h intake and output, last 24 h labs and trends, and last 24 h imaging results.  Letha Cape, [facility] 05/30/2022, 10:32 AM Please see Amion for pager number during day hours 7:00am-4:30pm or 7:00am -11:30am on weekends

## 2022-05-30 NOTE — ED Notes (Signed)
Called [facility] -- informed pt bed assignment is ready

## 2022-05-30 NOTE — TOC CM/SW Note (Signed)
Transition of Care [facility]) Screening Note  Patient Details  Name: Kimberly Prince Date of Birth: Apr 15, 1990  Transition of Care [facility]) CM/SW Contact:    Ewing Schlein, LCSW Phone Number: 05/30/2022, 12:51 PM  Transition of Care Department [facility]) has reviewed patient and no TOC needs have been identified at this time. We will continue to monitor patient advancement through interdisciplinary progression rounds. If new patient transition needs arise, please place a TOC consult.

## 2022-05-31 ENCOUNTER — Encounter (HOSPITAL_COMMUNITY)
Admission: EM | Disposition: A | Discharge status: Home / Self Care | Payer: Self-pay | Source: Home / Self Care | Attending: Emergency Medicine

## 2022-05-31 ENCOUNTER — Observation Stay (HOSPITAL_COMMUNITY): Payer: BC Managed Care – PPO | Admitting: Anesthesiology

## 2022-05-31 ENCOUNTER — Other Ambulatory Visit: Payer: Self-pay

## 2022-05-31 ENCOUNTER — Encounter (HOSPITAL_COMMUNITY): Payer: Self-pay

## 2022-05-31 DIAGNOSIS — K8012 Calculus of gallbladder with acute and chronic cholecystitis without obstruction: Secondary | ICD-10-CM | POA: Diagnosis not present

## 2022-05-31 HISTORY — PX: CHOLECYSTECTOMY: SHX55

## 2022-05-31 LAB — SURGICAL PCR SCREEN
MRSA, PCR: NEGATIVE
Staphylococcus aureus: NEGATIVE

## 2022-05-31 LAB — COMPREHENSIVE METABOLIC PANEL
ALT: 28 U/L (ref 0–44)
AST: 28 U/L (ref 15–41)
Albumin: 3 g/dL — ABNORMAL LOW (ref 3.5–5.0)
Alkaline Phosphatase: 54 U/L (ref 38–126)
Anion gap: 9 (ref 5–15)
BUN: <5 mg/dL — ABNORMAL LOW (ref 6–20)
CO2: 21 mmol/L — ABNORMAL LOW (ref 22–32)
Calcium: 8 mg/dL — ABNORMAL LOW (ref 8.9–10.3)
Chloride: 107 mmol/L (ref 98–111)
Creatinine, Ser: 0.62 mg/dL (ref 0.44–1.00)
GFR, Estimated: >60 mL/min (ref >60)
Glucose, Bld: 105 mg/dL — ABNORMAL HIGH (ref 70–99)
Potassium: 3.5 mmol/L (ref 3.5–5.1)
Sodium: 137 mmol/L (ref 135–145)
Total Bilirubin: 0.5 mg/dL (ref 0.3–1.2)
Total Protein: 6.1 g/dL — ABNORMAL LOW (ref 6.5–8.1)

## 2022-05-31 SURGERY — LAPAROSCOPIC CHOLECYSTECTOMY
Anesthesia: General

## 2022-05-31 MED ORDER — OXYCODONE HCL 5 MG PO TABS: 5.0000 mg | ORAL_TABLET | Freq: Once | ORAL | Status: AC | PRN

## 2022-05-31 MED ORDER — ONDANSETRON HCL 4 MG/2ML IJ SOLN
INTRAMUSCULAR | Status: DC | PRN
  Administered 2022-05-31: 4 mg via INTRAVENOUS

## 2022-05-31 MED ORDER — ONDANSETRON HCL 4 MG/2ML IJ SOLN
INTRAMUSCULAR | Status: AC
  Filled 2022-05-31: qty 2

## 2022-05-31 MED ORDER — CHLORHEXIDINE GLUCONATE 0.12 % MT SOLN
15.0000 mL | Freq: Once | OROMUCOSAL | Status: AC
  Administered 2022-05-31: 15 mL via OROMUCOSAL

## 2022-05-31 MED ORDER — MIDAZOLAM HCL 2 MG/2ML IJ SOLN
INTRAMUSCULAR | Status: AC
  Filled 2022-05-31: qty 2

## 2022-05-31 MED ORDER — ONDANSETRON HCL 4 MG/2ML IJ SOLN
INTRAMUSCULAR | Status: AC
  Filled 2022-05-31: qty 4

## 2022-05-31 MED ORDER — 0.9 % SODIUM CHLORIDE (POUR BTL) OPTIME
TOPICAL | Status: DC | PRN
  Administered 2022-05-31: 1000 mL

## 2022-05-31 MED ORDER — LACTATED RINGERS IR SOLN
Status: DC | PRN
  Administered 2022-05-31: 1000 mL

## 2022-05-31 MED ORDER — FENTANYL CITRATE PF 50 MCG/ML IJ SOSY
25.0000 ug | PREFILLED_SYRINGE | INTRAMUSCULAR | Status: DC | PRN
  Administered 2022-05-31 (×3): 50 ug via INTRAVENOUS

## 2022-05-31 MED ORDER — BUPIVACAINE HCL (PF) 0.25 % IJ SOLN
INTRAMUSCULAR | Status: AC
  Filled 2022-05-31: qty 30

## 2022-05-31 MED ORDER — PROPOFOL 10 MG/ML IV BOLUS
INTRAVENOUS | Status: AC
  Filled 2022-05-31: qty 20

## 2022-05-31 MED ORDER — ROCURONIUM BROMIDE 10 MG/ML (PF) SYRINGE
PREFILLED_SYRINGE | INTRAVENOUS | Status: DC | PRN
  Administered 2022-05-31: 70 mg via INTRAVENOUS

## 2022-05-31 MED ORDER — DEXAMETHASONE SODIUM PHOSPHATE 10 MG/ML IJ SOLN
INTRAMUSCULAR | Status: DC | PRN
  Administered 2022-05-31: 10 mg via INTRAVENOUS

## 2022-05-31 MED ORDER — FENTANYL CITRATE PF 50 MCG/ML IJ SOSY
PREFILLED_SYRINGE | INTRAMUSCULAR | Status: AC
  Administered 2022-05-31: 25 ug via INTRAVENOUS
  Filled 2022-05-31: qty 3

## 2022-05-31 MED ORDER — BUPIVACAINE HCL (PF) 0.25 % IJ SOLN
INTRAMUSCULAR | Status: DC | PRN
  Administered 2022-05-31: 30 mL

## 2022-05-31 MED ORDER — PHENYLEPHRINE 80 MCG/ML (10ML) SYRINGE FOR IV PUSH (FOR BLOOD PRESSURE SUPPORT)
PREFILLED_SYRINGE | INTRAVENOUS | Status: AC
  Filled 2022-05-31: qty 20

## 2022-05-31 MED ORDER — LIDOCAINE 2% (20 MG/ML) 5 ML SYRINGE
INTRAMUSCULAR | Status: DC | PRN
  Administered 2022-05-31: 80 mg via INTRAVENOUS

## 2022-05-31 MED ORDER — PHENYLEPHRINE 80 MCG/ML (10ML) SYRINGE FOR IV PUSH (FOR BLOOD PRESSURE SUPPORT)
PREFILLED_SYRINGE | INTRAVENOUS | Status: DC | PRN
  Administered 2022-05-31: 80 ug via INTRAVENOUS

## 2022-05-31 MED ORDER — KETOROLAC TROMETHAMINE 30 MG/ML IJ SOLN
INTRAMUSCULAR | Status: AC
  Filled 2022-05-31: qty 1

## 2022-05-31 MED ORDER — OXYCODONE HCL 5 MG PO TABS: 5.0000 mg | ORAL_TABLET | Freq: Four times a day (QID) | ORAL | 0 refills | Status: AC | PRN

## 2022-05-31 MED ORDER — MIDAZOLAM HCL 5 MG/5ML IJ SOLN
INTRAMUSCULAR | Status: DC | PRN
  Administered 2022-05-31: 2 mg via INTRAVENOUS

## 2022-05-31 MED ORDER — ROCURONIUM BROMIDE 10 MG/ML (PF) SYRINGE
PREFILLED_SYRINGE | INTRAVENOUS | Status: AC
  Filled 2022-05-31: qty 10

## 2022-05-31 MED ORDER — SODIUM CHLORIDE (PF) 0.9 % IJ SOLN
INTRAMUSCULAR | Status: AC
  Filled 2022-05-31: qty 20

## 2022-05-31 MED ORDER — VASOPRESSIN 20 UNIT/ML IV SOLN
INTRAVENOUS | Status: AC
  Filled 2022-05-31: qty 1

## 2022-05-31 MED ORDER — FENTANYL CITRATE (PF) 250 MCG/5ML IJ SOLN
INTRAMUSCULAR | Status: AC
  Filled 2022-05-31: qty 5

## 2022-05-31 MED ORDER — DEXAMETHASONE SODIUM PHOSPHATE 10 MG/ML IJ SOLN
INTRAMUSCULAR | Status: AC
  Filled 2022-05-31: qty 2

## 2022-05-31 MED ORDER — PROPOFOL 10 MG/ML IV BOLUS
INTRAVENOUS | Status: DC | PRN
  Administered 2022-05-31: 200 mg via INTRAVENOUS

## 2022-05-31 MED ORDER — INDOCYANINE GREEN 25 MG IV SOLR
2.5000 mg | INTRAVENOUS | Status: AC
  Administered 2022-05-31: 2.5 mg via INTRAVENOUS
  Filled 2022-05-31: qty 10

## 2022-05-31 MED ORDER — OXYCODONE HCL 5 MG PO TABS
ORAL_TABLET | ORAL | Status: AC
  Administered 2022-05-31: 5 mg via ORAL
  Filled 2022-05-31: qty 1

## 2022-05-31 MED ORDER — DEXAMETHASONE SODIUM PHOSPHATE 10 MG/ML IJ SOLN
INTRAMUSCULAR | Status: AC
  Filled 2022-05-31: qty 1

## 2022-05-31 MED ORDER — FENTANYL CITRATE (PF) 100 MCG/2ML IJ SOLN
INTRAMUSCULAR | Status: DC | PRN
  Administered 2022-05-31: 25 ug via INTRAVENOUS
  Administered 2022-05-31: 50 ug via INTRAVENOUS
  Administered 2022-05-31: 150 ug via INTRAVENOUS
  Administered 2022-05-31: 25 ug via INTRAVENOUS

## 2022-05-31 MED ORDER — LIDOCAINE HCL (PF) 2 % IJ SOLN
INTRAMUSCULAR | Status: AC
  Filled 2022-05-31: qty 10

## 2022-05-31 MED ORDER — ENOXAPARIN SODIUM 40 MG/0.4ML IJ SOSY: 40.0000 mg | PREFILLED_SYRINGE | Freq: Every day | INTRAMUSCULAR | Status: DC

## 2022-05-31 MED ORDER — AMISULPRIDE (ANTIEMETIC) 5 MG/2ML IV SOLN
INTRAVENOUS | Status: AC
  Administered 2022-05-31: 10 mg via INTRAVENOUS
  Filled 2022-05-31: qty 4

## 2022-05-31 MED ORDER — LACTATED RINGERS IV SOLN: INTRAVENOUS | Status: DC

## 2022-05-31 MED ORDER — KETOROLAC TROMETHAMINE 30 MG/ML IJ SOLN
INTRAMUSCULAR | Status: DC | PRN
  Administered 2022-05-31: 30 mg via INTRAVENOUS

## 2022-05-31 MED ORDER — ORAL CARE MOUTH RINSE: 15.0000 mL | Freq: Once | OROMUCOSAL | Status: AC

## 2022-05-31 MED ORDER — AMISULPRIDE (ANTIEMETIC) 5 MG/2ML IV SOLN: 10.0000 mg | Freq: Once | INTRAVENOUS | Status: AC | PRN

## 2022-05-31 MED ORDER — SODIUM CHLORIDE 0.9 % IV SOLN
2.0000 g | INTRAVENOUS | Status: AC
  Administered 2022-05-31: 2 g via INTRAVENOUS
  Filled 2022-05-31: qty 20

## 2022-05-31 MED ORDER — SUGAMMADEX SODIUM 200 MG/2ML IV SOLN
INTRAVENOUS | Status: DC | PRN
  Administered 2022-05-31: 400 mg via INTRAVENOUS

## 2022-05-31 SURGICAL SUPPLY — 47 items
APPLICATOR ARISTA FLEXITIP XL (MISCELLANEOUS) IMPLANT
APPLIER CLIP 5 13 M/L LIGAMAX5 (MISCELLANEOUS) ×1
APPLIER CLIP ROT 10 11.4 M/L (STAPLE)
BAG COUNTER SPONGE SURGICOUNT (BAG) IMPLANT
CABLE HIGH FREQUENCY MONO STRZ (ELECTRODE) ×1 IMPLANT
CHLORAPREP W/TINT 26 (MISCELLANEOUS) ×1 IMPLANT
CLIP APPLIE 5 13 M/L LIGAMAX5 (MISCELLANEOUS) IMPLANT
CLIP APPLIE ROT 10 11.4 M/L (STAPLE) IMPLANT
CLIP LIGATING HEMO O LOK GREEN (MISCELLANEOUS) IMPLANT
COVER MAYO STAND XLG (MISCELLANEOUS) IMPLANT
COVER SURGICAL LIGHT HANDLE (MISCELLANEOUS) ×1 IMPLANT
DERMABOND ADVANCED .7 DNX12 (GAUZE/BANDAGES/DRESSINGS) IMPLANT
DRAPE C-ARM 42X120 X-RAY (DRAPES) IMPLANT
DRSG TEGADERM 2-3/8X2-3/4 SM (GAUZE/BANDAGES/DRESSINGS) ×3 IMPLANT
DRSG TEGADERM 4X4.75 (GAUZE/BANDAGES/DRESSINGS) ×1 IMPLANT
ELECT REM PT RETURN 15FT ADLT (MISCELLANEOUS) ×1 IMPLANT
GAUZE SPONGE 2X2 8PLY STRL LF (GAUZE/BANDAGES/DRESSINGS) ×1 IMPLANT
GLOVE BIO SURGEON STRL SZ7.5 (GLOVE) ×1 IMPLANT
GLOVE INDICATOR 8.0 STRL GRN (GLOVE) ×1 IMPLANT
GOWN STRL REUS W/ TWL XL LVL3 (GOWN DISPOSABLE) ×1 IMPLANT
GOWN STRL REUS W/TWL XL LVL3 (GOWN DISPOSABLE) ×1
GRASPER SUT TROCAR 14GX15 (MISCELLANEOUS) IMPLANT
HEMOSTAT ARISTA ABSORB 3G PWDR (HEMOSTASIS) IMPLANT
HEMOSTAT SNOW SURGICEL 2X4 (HEMOSTASIS) IMPLANT
IRRIG SUCT STRYKERFLOW 2 WTIP (MISCELLANEOUS) ×1
IRRIGATION SUCT STRKRFLW 2 WTP (MISCELLANEOUS) ×1 IMPLANT
KIT BASIN OR (CUSTOM PROCEDURE TRAY) ×1 IMPLANT
KIT TURNOVER KIT A (KITS) IMPLANT
L-HOOK LAP DISP 36CM (ELECTROSURGICAL)
LHOOK LAP DISP 36CM (ELECTROSURGICAL) IMPLANT
POUCH RETRIEVAL ECOSAC 10 (ENDOMECHANICALS) ×1 IMPLANT
SCISSORS LAP 5X35 DISP (ENDOMECHANICALS) ×1 IMPLANT
SET CHOLANGIOGRAPH MIX (MISCELLANEOUS) IMPLANT
SET TUBE SMOKE EVAC HIGH FLOW (TUBING) ×1 IMPLANT
SLEEVE Z-THREAD 5X100MM (TROCAR) ×2 IMPLANT
SPIKE FLUID TRANSFER (MISCELLANEOUS) ×1 IMPLANT
STRIP CLOSURE SKIN 1/2X4 (GAUZE/BANDAGES/DRESSINGS) ×1 IMPLANT
SUT MNCRL AB 4-0 PS2 18 (SUTURE) ×1 IMPLANT
SUT VIC AB 0 UR5 27 (SUTURE) IMPLANT
SUT VICRYL 0 TIES 12 18 (SUTURE) IMPLANT
SUT VICRYL 0 UR6 27IN ABS (SUTURE) IMPLANT
TOWEL OR 17X26 10 PK STRL BLUE (TOWEL DISPOSABLE) ×1 IMPLANT
TOWEL OR NON WOVEN STRL DISP B (DISPOSABLE) ×1 IMPLANT
TRAY LAPAROSCOPIC (CUSTOM PROCEDURE TRAY) ×1 IMPLANT
TROCAR 11X100 Z THREAD (TROCAR) IMPLANT
TROCAR BALLN 12MMX100 BLUNT (TROCAR) ×1 IMPLANT
TROCAR Z-THREAD OPTICAL 5X100M (TROCAR) ×1 IMPLANT

## 2022-05-31 NOTE — Progress Notes (Signed)
32 year old female came in with abdominal pain and diagnosed with acute cholecystitis, biliary colic. Allergies are sulfa antibiotics. Patient alert and oriented x 4. Speech is clear. Vitals: Temp:98.2, HR: 77, BP: 117/69, RR: 18, O2 95% at RA. S1 and S2 heard. Regular rate and rhythm. Lung sounds are clear and unlabored. No adventitious sounds. Bowel sounds heard in all 4 quadrants. Laparoscopic cholecystectomy with ICG Dye procedure was done preoperatively. Gallbladder was removed. Tender at right upper quadrant. Patient's urine is yellow and clear. Skin is clean, dry, and intact. Surgical site is at right upper quadrant of abdomen. Patient has equal strength in both extremities. Capillary refill less than 3 seconds. No edema. Pain 5/10 post surgery at right upper abdomen. Pain meds were administered post op.  Mel Almond, Student RN

## 2022-05-31 NOTE — Discharge Instructions (Signed)

## 2022-05-31 NOTE — Op Note (Signed)
Kimberly Prince [id number] 07/10/1990 05/31/2022  Laparoscopic Cholecystectomy with near infrared fluorescent cholangiography procedure Note  Indications: This patient presents with symptomatic gallbladder disease and will undergo laparoscopic cholecystectomy.  Pre-operative Diagnosis: Calculus of gallbladder with acute cholecystitis, without mention of obstruction  Post-operative Diagnosis: Same  Surgeon: Gaynelle Adu MD FACS  Assistants: none  Anesthesia: General endotracheal anesthesia  Procedure Details  The patient was seen again in the Holding Room. The risks, benefits, complications, treatment options, and expected outcomes were discussed with the patient. The possibilities of reaction to medication, pulmonary aspiration, perforation of viscus, bleeding, recurrent infection, finding a normal gallbladder, the need for additional procedures, failure to diagnose a condition, the possible need to convert to an open procedure, and creating a complication requiring transfusion or operation were discussed with the patient. The likelihood of improving the patient's symptoms with return to their baseline status is good.  The patient and/or family concurred with the proposed plan, giving informed consent. The site of surgery properly noted. The patient was taken to Operating Room, identified as Kimberly Prince and the procedure verified as Laparoscopic Cholecystectomy with ICG dye.  A Time Out was held and the above information confirmed. Antibiotic prophylaxis was administered.    ICG dye was administered preoperatively.    General endotracheal anesthesia was then administered and tolerated well. After the induction, the abdomen was prepped with Chloraprep and draped in the sterile fashion. The patient was positioned in the supine position.  Given her central truncal obesity I elected gain access to the abdomen using the Optiview technique.  A small incision was made just to the left of the midline  underneath the left subcostal margin.  Then using a 0 degree 5 mm laparoscope through a 5 mm trocar I advanced the laparoscope through all layers of abdominal cavity.  Pneumoperitoneum was smoothly established up to a patient pressure of 15 mmHg.  The laparoscope was advanced and the abdominal cavity was surveilled.  There was no evidence of injury to surrounding viscera.  Patient had some omentum stuck to her lower midline from a prior small incision.  A 5 mm trocar was placed in the supraumbilical position under direct visualization.  I then exchanged the 5 mm Optiview trocar in the left upper quadrant for a 12 mm trocar.   Two 5-mm ports were placed in the right upper quadrant. All skin incisions were infiltrated with a local anesthetic agent before making the incision and placing the trocars.   We positioned the patient in reverse Trendelenburg, tilted slightly to the patient's left.  The gallbladder was identified.  The gallbladder was distended and had some edema within the wall and in order to facilitate retraction I aspirated the gallbladder., the fundus grasped and retracted cephalad. Adhesions were lysed bluntly and with the electrocautery where indicated, taking care not to injure any adjacent organs or viscus. The infundibulum was grasped and retracted laterally, exposing the peritoneum overlying the triangle of Calot. This was then divided and exposed in a blunt fashion. A critical view of the cystic duct and cystic artery was obtained.  The cystic duct was clearly identified and bluntly dissected circumferentially.  Utilizing the Stry[employer] camera system near infrared fluorescent activity was visualized in the liver, cystic duct, common hepatic duct and common bile duct, small intestine.  This served as a secondary confirmation of our anatomy.  The cystic duct was then ligated with clips and divided. The cystic artery which had been identified & dissected free was ligated with clips and  divided as  well.   The gallbladder was dissected from the liver bed in retrograde fashion with the electrocautery. The gallbladder was removed and placed in an Ecco sac.  The liver bed was irrigated and inspected. Hemostasis was achieved with the electrocautery. Copious irrigation was utilized and was repeatedly aspirated until clear.  The gallbladder and Ecco sac were then removed through the left upper quadrant  port site.  I then closed the left upper quadrant fascial defect with 3 interrupted 0 Vicryl's using a PMI suture passer with laparoscopic guidance.  We again inspected the right upper quadrant for hemostasis.  The left upper quadrant closure was inspected and there was no air leak and nothing trapped within the closure. Pneumoperitoneum was released as we removed the trocars.  4-0 Monocryl was used to close the skin.   Dermabond was applied. The patient was then extubated and brought to the recovery room in stable condition. Instrument, sponge, and needle counts were correct at closure and at the conclusion of the case.   Findings: Cholecystitis with Cholelithiasis + critical view Near infrared fluorescent cholangiography demonstrated activity in the common hepatic duct, common bile duct, cystic duct and small intestine  Estimated Blood Loss: Minimal         Drains: none         Specimens: Gallbladder           Complications: None; patient tolerated the procedure well.         Disposition: PACU - hemodynamically stable.         Condition: stable  Kimberly Prince. Andrey Campanile, MD, FACS General, Bariatric, & Minimally Invasive Surgery [facility],  [facility]

## 2022-05-31 NOTE — Progress Notes (Signed)
Patient was given discharge instructions. All questions were answered. Transported via wheelchair to main entrance. 

## 2022-05-31 NOTE — Discharge Summary (Signed)
[facility] Discharge Summary   Patient ID: Kimberly Prince MRN: [id number] DOB/AGE: 23-Jun-1990 31 y.o.  Admit date: 05/29/2022 Discharge date: 05/31/2022  Admitting Diagnosis: Acute cholecystitis  Discharge Diagnosis S/P laparoscopic cholecystectomy   Consultants None   Imaging: NM Hepatobiliary Liver Func  Result Date: 05/30/2022 CLINICAL DATA:  Right upper quadrant abdominal pain. EXAM: NUCLEAR MEDICINE HEPATOBILIARY IMAGING TECHNIQUE: Sequential images of the abdomen were obtained out to 60 minutes following intravenous administration of radiopharmaceutical. RADIOPHARMACEUTICALS:  5.31 mCi Tc-49m  Choletec IV COMPARISON:  CT from 05/29/2022 FINDINGS: Prompt uptake and biliary excretion of activity by the liver is seen. Biliary activity passes into small bowel, consistent with patent common bile duct. After 60 minutes of imaging there was no gallbladder activity visualized. The patient subsequently received 3 mg of morphine and additional imaging was performed for 30 minutes. No signs of gallbladder activity identified on the delayed images. IMPRESSION: 1. Nonvisualization of the gallbladder compatible with cystic duct obstruction and cholecystitis. 2. Patent common bile duct with normal biliary to bowel transit. Electronically Signed   By: Signa Kell M.D.   On: 05/30/2022 16:40   CT ABDOMEN PELVIS W CONTRAST  Result Date: 05/29/2022 CLINICAL DATA:  Acute abdominal pain EXAM: CT ABDOMEN AND PELVIS WITH CONTRAST TECHNIQUE: Multidetector CT imaging of the abdomen and pelvis was performed using the standard protocol following bolus administration of intravenous contrast. RADIATION DOSE REDUCTION: This exam was performed according to the departmental dose-optimization program which includes automated exposure control, adjustment of the mA and/or kV according to patient size and/or use of iterative reconstruction technique. CONTRAST:  OMNIPAQUE IOHEXOL 300 MG/ML  SOLN  COMPARISON:  None Available. FINDINGS: Lower chest: No acute abnormality. Hepatobiliary: Liver shows decreased attenuation consistent fatty infiltration. Gallstones are seen without complicating factors. Pancreas: Unremarkable. No pancreatic ductal dilatation or surrounding inflammatory changes. Spleen: Normal in size without focal abnormality. Adrenals/Urinary Tract: Adrenal glands are within normal limits. Kidneys demonstrate a normal enhancement pattern. No renal calculi or obstructive changes are noted. The bladder is decompressed. Stomach/Bowel: The appendix is within normal limits. No obstructive or inflammatory changes of the colon are noted. Small bowel and stomach are unremarkable. Vascular/Lymphatic: No significant vascular findings are present. No enlarged abdominal or pelvic lymph nodes. Reproductive: Uterus and bilateral adnexa are unremarkable. Other: No abdominal wall hernia or abnormality. No abdominopelvic ascites. Musculoskeletal: No acute or significant osseous findings. IMPRESSION: Cholelithiasis without complicating factors. Fatty liver. Electronically Signed   By: Alcide Clever M.D.   On: 05/29/2022 21:22    Procedures Dr. Gaynelle Adu (05/31/22) - Laparoscopic Cholecystectomy with ICG  Hospital Course:  Patient is a 32 year old female who presented to the ED with abdominal pain.  Workup showed acute cholecystitis.  Patient was admitted and underwent procedure listed above.  Tolerated procedure well and was transferred to the floor.  Diet was advanced as tolerated.  On POD0, the patient was voiding well, tolerating diet, ambulating well, pain well controlled, vital signs stable, incisions c/d/i and felt stable for discharge home.  Patient will follow up in our office in 3 weeks and knows to call with questions or concerns.  She will call to confirm appointment date/time.    Physical Exam: General:  Alert, NAD, pleasant, comfortable Abd:  Soft, ND, mild tenderness, incisions C/D/I  I or  a member of my team have reviewed this patient in the Controlled Substance Database.   Allergies as of 05/31/2022       Reactions   Sulfa Antibiotics  Hives        Medication List     TAKE these medications    acetaminophen 500 MG tablet Commonly known as: TYLENOL Take 1,000 mg by mouth every 6 (six) hours as needed for moderate pain.   ibuprofen 600 MG tablet Commonly known as: ADVIL Take 1 tablet (600 mg total) by mouth every 6 (six) hours as needed for mild pain, moderate pain or cramping.   lisdexamfetamine 60 MG capsule Commonly known as: VYVANSE Take 60 mg by mouth every morning.   oxyCODONE 5 MG immediate release tablet Commonly known as: Oxy IR/ROXICODONE Take 1 tablet (5 mg total) by mouth every 6 (six) hours as needed for moderate pain, severe pain or breakthrough pain. What changed: how much to take   Ozempic (1 MG/DOSE) 4 MG/3ML Sopn Generic drug: Semaglutide (1 MG/DOSE) Inject 1 mg into the skin every 7 (seven) days.   sertraline 100 MG tablet Commonly known as: ZOLOFT Take 100 mg by mouth daily.          Follow-up Information     [facility], Hedda Slade, [place]. Go on 06/27/2022.   Specialty: General Surgery Why: 11 AM. Arrive 30 minutes prior to your appointment time, Please bring your insurance card and photo ID Contact information: [address] [place] [address] [facility] [place] [address] [phone]                 Signed: Juliet Rude , [facility] 05/31/2022, 4:13 PM Please see Amion for pager number during day hours 7:00am-4:30pm

## 2022-05-31 NOTE — Anesthesia Preprocedure Evaluation (Signed)
Anesthesia Evaluation  Patient identified by MRN, date of birth, ID band Patient awake    Reviewed: Allergy & Precautions, NPO status , Patient's Chart, lab work & pertinent test results  Airway Mallampati: II  TM Distance: >3 FB Neck ROM: Full    Dental  (+) Dental Advisory Given   Pulmonary neg pulmonary ROS   breath sounds clear to auscultation       Cardiovascular negative cardio ROS  Rhythm:Regular Rate:Normal     Neuro/Psych negative neurological ROS     GI/Hepatic negative GI ROS, Neg liver ROS,,,  Endo/Other    Morbid obesity  Renal/GU negative Renal ROS     Musculoskeletal   Abdominal   Peds  Hematology negative hematology ROS (+)   Anesthesia Other Findings   Reproductive/Obstetrics                             Anesthesia Physical Anesthesia Plan  ASA: 3  Anesthesia Plan: General   Post-op Pain Management: Tylenol PO (pre-op)* and Toradol IV (intra-op)*   Induction: Intravenous  PONV Risk Score and Plan: 3 and Dexamethasone, Ondansetron, Midazolam and Treatment may vary due to age or medical condition  Airway Management Planned: Oral ETT  Additional Equipment:   Intra-op Plan:   Post-operative Plan: Extubation in OR  Informed Consent: I have reviewed the patients History and Physical, chart, labs and discussed the procedure including the risks, benefits and alternatives for the proposed anesthesia with the patient or authorized representative who has indicated his/her understanding and acceptance.     Dental advisory given  Plan Discussed with: CRNA  Anesthesia Plan Comments:        Anesthesia Quick Evaluation

## 2022-05-31 NOTE — Interval H&P Note (Signed)
History and Physical Interval Note:  05/31/2022 8:28 AM  Mariea Stable  has presented today for surgery, with the diagnosis of cholecystitis.  The various methods of treatment have been discussed with the patient and family. After consideration of risks, benefits and other options for treatment, the patient has consented to  Procedure(s): LAPAROSCOPIC CHOLECYSTECTOMY WITH ICG DYE (N/A) as a surgical intervention.  The patient's history has been reviewed, patient examined, no change in status, stable for surgery.  I have reviewed the patient's chart and labs.  Questions were answered to the patient's satisfaction.    I believe most of the patient's symptoms are consistent with gallbladder disease.  We discussed gallbladder disease. T  I discussed laparoscopic cholecystectomy with possible IOC in detail.  The patient was shown diagrams detailing the procedure.  We discussed the risks and benefits of a laparoscopic cholecystectomy including, but not limited to bleeding, infection, injury to surrounding structures such as the intestine or liver, bile leak, retained gallstones, need to convert to an open procedure, prolonged diarrhea, blood clots such as  DVT, common bile duct injury, anesthesia risks, and possible need for additional procedures.  We discussed the typical post-operative recovery course. I explained that the likelihood of improvement of their symptoms is good. We did discuss that cholecystectomy may not ameliorate all of her GI symptoms but she does have gallstones, +HIDA, upper abd pain.    Gaynelle Adu

## 2022-05-31 NOTE — Anesthesia Procedure Notes (Signed)
Procedure Name: Intubation Date/Time: 05/31/2022 10:29 AM  Performed by: Theodosia Quay, CRNAPre-anesthesia Checklist: Patient identified, Emergency Drugs available, Suction available, Patient being monitored and Timeout performed Patient Re-evaluated:Patient Re-evaluated prior to induction Oxygen Delivery Method: Circle system utilized Preoxygenation: Pre-oxygenation with 100% oxygen Induction Type: IV induction Ventilation: Mask ventilation without difficulty Laryngoscope Size: Mac and 3 Grade View: Grade I Tube type: Oral Tube size: 7.0 mm Number of attempts: 1 Airway Equipment and Method: Stylet Placement Confirmation: ETT inserted through vocal cords under direct vision, positive ETCO2, CO2 detector and breath sounds checked- equal and bilateral Secured at: 21 cm Tube secured with: Tape Dental Injury: Teeth and Oropharynx as per pre-operative assessment  Comments: ATOI

## 2022-05-31 NOTE — Plan of Care (Signed)

## 2022-05-31 NOTE — Transfer of Care (Signed)
Immediate Anesthesia Transfer of Care Note  Patient: Kimberly Prince  Procedure(s) Performed: Procedure(s): LAPAROSCOPIC CHOLECYSTECTOMY WITH ICG DYE (N/A)  Patient Location: PACU  Anesthesia Type:General  Level of Consciousness:  sedated, patient cooperative and responds to stimulation  Airway & Oxygen Therapy:Patient Spontanous Breathing and Patient connected to face mask oxgen  Post-op Assessment:  Report given to PACU RN and Post -op Vital signs reviewed and stable  Post vital signs:  Reviewed and stable  Last Vitals:  Vitals:   05/31/22 0142 05/31/22 0535  BP: (!) 110/59 100/66  Pulse: 76 63  Resp: 18 18  Temp: 36.9 C 36.9 C  SpO2: 96% 98%    Complications: No apparent anesthesia complications

## 2022-06-01 ENCOUNTER — Encounter (HOSPITAL_COMMUNITY): Payer: Self-pay | Admitting: General Surgery

## 2022-06-01 LAB — SURGICAL PATHOLOGY

## 2022-06-01 NOTE — Anesthesia Postprocedure Evaluation (Signed)
Anesthesia Post Note  Patient: Kimberly Prince  Procedure(s) Performed: LAPAROSCOPIC CHOLECYSTECTOMY WITH ICG DYE     Patient location during evaluation: PACU Anesthesia Type: General Level of consciousness: awake and alert Pain management: pain level controlled Vital Signs Assessment: post-procedure vital signs reviewed and stable Respiratory status: spontaneous breathing, nonlabored ventilation, respiratory function stable and patient connected to nasal cannula oxygen Cardiovascular status: blood pressure returned to baseline and stable Postop Assessment: no apparent nausea or vomiting Anesthetic complications: no   No notable events documented.  Last Vitals:  Vitals:   05/31/22 1514 05/31/22 1609  BP: 128/63 (!) 146/84  Pulse: 77 77  Resp:  14  Temp: 37.1 C 36.8 C  SpO2: 95% 97%    Last Pain:  Vitals:   05/31/22 1609  TempSrc: Oral  PainSc:                  Kennieth Rad
